# Patient Record
Sex: Male | Born: 1940 | Race: White | Hispanic: No | Marital: Married | Smoking: Former smoker
Health system: Southern US, Community
[De-identification: ages and names within clinical notes are randomized; demographics above are authoritative.]

## PROBLEM LIST (undated history)

## (undated) DIAGNOSIS — K219 Gastro-esophageal reflux disease without esophagitis: Secondary | ICD-10-CM

## (undated) DIAGNOSIS — B029 Zoster without complications: Secondary | ICD-10-CM

## (undated) DIAGNOSIS — H269 Unspecified cataract: Secondary | ICD-10-CM

## (undated) DIAGNOSIS — Z8719 Personal history of other diseases of the digestive system: Secondary | ICD-10-CM

## (undated) DIAGNOSIS — H409 Unspecified glaucoma: Secondary | ICD-10-CM

## (undated) DIAGNOSIS — R350 Frequency of micturition: Secondary | ICD-10-CM

## (undated) DIAGNOSIS — R519 Headache, unspecified: Secondary | ICD-10-CM

## (undated) DIAGNOSIS — J189 Pneumonia, unspecified organism: Secondary | ICD-10-CM

## (undated) DIAGNOSIS — R51 Headache: Secondary | ICD-10-CM

## (undated) DIAGNOSIS — I10 Essential (primary) hypertension: Secondary | ICD-10-CM

## (undated) DIAGNOSIS — E7800 Pure hypercholesterolemia, unspecified: Secondary | ICD-10-CM

## (undated) DIAGNOSIS — R42 Dizziness and giddiness: Secondary | ICD-10-CM

## (undated) DIAGNOSIS — M109 Gout, unspecified: Secondary | ICD-10-CM

---

## 2009-05-04 ENCOUNTER — Ambulatory Visit: Payer: Self-pay | Admitting: Orthopedic Surgery

## 2009-05-11 ENCOUNTER — Inpatient Hospital Stay: Payer: Self-pay | Admitting: Orthopedic Surgery

## 2009-05-11 HISTORY — PX: JOINT REPLACEMENT: SHX530

## 2009-09-08 ENCOUNTER — Ambulatory Visit: Payer: Self-pay | Admitting: Orthopedic Surgery

## 2009-11-21 DIAGNOSIS — B029 Zoster without complications: Secondary | ICD-10-CM

## 2009-11-21 HISTORY — PX: HEMORROIDECTOMY: SUR656

## 2009-11-21 HISTORY — DX: Zoster without complications: B02.9

## 2012-01-18 ENCOUNTER — Ambulatory Visit: Payer: Self-pay | Admitting: Specialist

## 2012-05-27 ENCOUNTER — Other Ambulatory Visit: Payer: Self-pay | Admitting: Orthopedic Surgery

## 2012-05-27 MED ORDER — BUPIVACAINE 0.25 % ON-Q PUMP SINGLE CATH 300ML: 300.0000 mL | INJECTION | Status: DC

## 2012-05-27 MED ORDER — DEXAMETHASONE SODIUM PHOSPHATE 10 MG/ML IJ SOLN: 10.0000 mg | Freq: Once | INTRAMUSCULAR | Status: DC

## 2012-05-27 NOTE — Progress Notes (Signed)
Preoperative surgical orders have been place into the Epic hospital system for Kenneth Parker on 05/27/2012, 10:16 PM  by Patrica Duel for surgery on 08/10/2012.  Preop Total Knee orders including Bupivacaine On-Q pump, IV Tylenol, and IV Decadron as long as there are no contraindications to the above medications. Avel Peace, PA-C

## 2012-08-02 ENCOUNTER — Encounter (HOSPITAL_COMMUNITY)
Admission: RE | Admit: 2012-08-02 | Discharge: 2012-08-02 | Disposition: A | Discharge status: Home / Self Care | Payer: Medicare Other | Source: Ambulatory Visit | Attending: Orthopedic Surgery | Admitting: Orthopedic Surgery

## 2012-08-02 ENCOUNTER — Encounter (HOSPITAL_COMMUNITY): Payer: Self-pay

## 2012-08-02 ENCOUNTER — Ambulatory Visit (HOSPITAL_COMMUNITY)
Admission: RE | Admit: 2012-08-02 | Discharge: 2012-08-02 | Disposition: A | Discharge status: Home / Self Care | Payer: Medicare Other | Source: Ambulatory Visit | Attending: Orthopedic Surgery | Admitting: Orthopedic Surgery

## 2012-08-02 DIAGNOSIS — Y838 Other surgical procedures as the cause of abnormal reaction of the patient, or of later complication, without mention of misadventure at the time of the procedure: Secondary | ICD-10-CM | POA: Insufficient documentation

## 2012-08-02 DIAGNOSIS — Z79899 Other long term (current) drug therapy: Secondary | ICD-10-CM | POA: Insufficient documentation

## 2012-08-02 DIAGNOSIS — T84039A Mechanical loosening of unspecified internal prosthetic joint, initial encounter: Secondary | ICD-10-CM | POA: Insufficient documentation

## 2012-08-02 DIAGNOSIS — K219 Gastro-esophageal reflux disease without esophagitis: Secondary | ICD-10-CM | POA: Insufficient documentation

## 2012-08-02 DIAGNOSIS — Z01810 Encounter for preprocedural cardiovascular examination: Secondary | ICD-10-CM | POA: Insufficient documentation

## 2012-08-02 DIAGNOSIS — K449 Diaphragmatic hernia without obstruction or gangrene: Secondary | ICD-10-CM | POA: Insufficient documentation

## 2012-08-02 DIAGNOSIS — Z96659 Presence of unspecified artificial knee joint: Secondary | ICD-10-CM | POA: Insufficient documentation

## 2012-08-02 DIAGNOSIS — I10 Essential (primary) hypertension: Secondary | ICD-10-CM | POA: Insufficient documentation

## 2012-08-02 DIAGNOSIS — Z01812 Encounter for preprocedural laboratory examination: Secondary | ICD-10-CM | POA: Insufficient documentation

## 2012-08-02 HISTORY — DX: Frequency of micturition: R35.0

## 2012-08-02 HISTORY — DX: Gout, unspecified: M10.9

## 2012-08-02 HISTORY — DX: Essential (primary) hypertension: I10

## 2012-08-02 HISTORY — DX: Zoster without complications: B02.9

## 2012-08-02 HISTORY — DX: Dizziness and giddiness: R42

## 2012-08-02 HISTORY — DX: Pure hypercholesterolemia, unspecified: E78.00

## 2012-08-02 HISTORY — DX: Pneumonia, unspecified organism: J18.9

## 2012-08-02 HISTORY — DX: Personal history of other diseases of the digestive system: Z87.19

## 2012-08-02 LAB — APTT: aPTT: 38 seconds — ABNORMAL HIGH (ref 24–37)

## 2012-08-02 LAB — CBC
MCV: 84.3 fL (ref 78.0–100.0)
Platelets: 225 10*3/uL (ref 150–400)
RBC: 5.21 MIL/uL (ref 4.22–5.81)
WBC: 10.5 10*3/uL (ref 4.0–10.5)

## 2012-08-02 LAB — URINALYSIS, ROUTINE W REFLEX MICROSCOPIC
Bilirubin Urine: NEGATIVE
Leukocytes, UA: NEGATIVE
Nitrite: NEGATIVE
Specific Gravity, Urine: 1.016 (ref 1.005–1.030)
pH: 5 (ref 5.0–8.0)

## 2012-08-02 LAB — COMPREHENSIVE METABOLIC PANEL
ALT: 28 U/L (ref 0–53)
AST: 23 U/L (ref 0–37)
CO2: 24 mEq/L (ref 19–32)
Chloride: 104 mEq/L (ref 96–112)
GFR calc non Af Amer: 84 mL/min — ABNORMAL LOW (ref >90)
Potassium: 4.1 mEq/L (ref 3.5–5.1)
Sodium: 138 mEq/L (ref 135–145)
Total Bilirubin: 0.6 mg/dL (ref 0.3–1.2)

## 2012-08-02 NOTE — Patient Instructions (Addendum)
[address] [place]  08/02/2012   Your procedure is scheduled on:  08-10-2012  Report to [facility] at  915 AM.  Call this number if you have problems the morning of surgery: [phone]   Remember:   Do not eat food or drink liquids:After Midnight.  .  Take these medicines the morning of surgery with A SIP OF WATER: omeprazole   Do not wear jewelry or make up.  Do not wear lotions, powders, or perfumes.Do not wear deodorant.    Do not bring valuables to the hospital.  Contacts, dentures or bridgework may not be worn into surgery.  Leave suitcase in the car. After surgery it may be brought to your room.  For patients admitted to the hospital, checkout time is 11:00 AM the day of discharge                             Patients discharged the day of surgery will not be allowed to drive home. If going home same day of surgery, you must have someone stay with you the first 24 hours at home and arrange for some one to drive you home from hospital.    Special Instructions: CHG Shower Use Special Wash: 1/2 bottle night before surgery and 1/2 bottle morning  of surgery, use regular soap on face and front and back private area. Women do not shave legs or underarms for 2 days before showers. Men may shave face morning of surgery.  please read over the following fact sheets that you were given: MRSA Information, blood fact sheet, incentive spirometer fact sheet  Cain [facility] pre op nurse phone number [phone], call if needed , call Kailer Heindel after you see gr granger 08-06-2012 at 300 pm and tell her what dr granger said about your toe

## 2012-08-02 NOTE — Pre-Procedure Instructions (Signed)
Medical clearance note dr Theophilus Bones on chart

## 2012-08-02 NOTE — Progress Notes (Signed)
Kenneth Peace pa saw left middle toe red area per pt, pt instructed to call dr granger and show dr granger left middle toe.

## 2012-08-02 NOTE — Progress Notes (Signed)
08/02/12 1420  OBSTRUCTIVE SLEEP APNEA  Have you ever been diagnosed with sleep apnea through a sleep study? No  Do you snore loudly (loud enough to be heard through closed doors)?  1  Do you often feel tired, fatigued, or sleepy during the daytime? 0  Has anyone observed you stop breathing during your sleep? 0  Do you have, or are you being treated for high blood pressure? 1  BMI more than 35 kg/m2? 0  Age over 73 years old? 1  Neck circumference greater than 40 cm/18 inches? 0  Gender: 1  Obstructive Sleep Apnea Score 4   Score 4 or greater  Updated health history;Results sent to PCP

## 2012-08-07 NOTE — Progress Notes (Addendum)
Spoke with pt, pt saw dr Theophilus Bones and colchicine 0.6 mg bid added for gout, left middle  toe healing well, dr Lequita Halt aware of gout per pt, pt instructed to take clochicine am of surgery with sip of water

## 2012-08-09 ENCOUNTER — Other Ambulatory Visit: Payer: Self-pay | Admitting: Orthopedic Surgery

## 2012-08-09 NOTE — H&P (Signed)
Kenneth Parker  DOB: 06/21/1941 Married / Language: English / Race: White Male  Date of Admission:  08/10/2012  Chief complaint:  Right Knee Pain  History of Present Illness The patient is a 71 year old male who comes in for a preoperative History and Physical. The patient is scheduled for a right right knee tibial verus total knee revision to be performed by Dr. Frank V. Aluisio, MD at [facility] on 08/10/2012. The patient is a 71 year old male who presents today for follow up of their knee. The patient is being followed for their right painful total knee. Symptoms reported today include: pain and instability. The patient feels that they are doing poorly. Current treatment includes: bracing. The patient has not gotten any relief of their symptoms with NSAIDs (the Voltaren gel didnt seem to help at all). When I saw him last visit, I was concerned about possible loosening of the tibial component vs. just a post-contusion pain. Unfortunately, he's getting worse. He said despite the Voltaren gel and despite the brace he's still having a lot of discomfort. It's mainly anteromedial and just below the joint line. They have been treated conservatively in the past for the above stated problem and despite conservative measures, they continue to have progressive pain and severe functional limitations and dysfunction. They have failed non-operative management. It is felt that they would benefit from undergoing total joint replacement. Risks and benefits of the procedure have been discussed with the patient and they elect to proceed with surgery. There are no active contraindications to surgery such as ongoing infection or rapidly progressive neurological disease.   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 father Father. Deceased, Myocardial infarction. age  62 Mother. Deceased, Congestive Heart Failure. age 85   Social History Living situation. live with spouse Marital status. married Illicit drug use. no Drug/Alcohol Rehab (Previously). no Exercise. Exercises daily; does running / walking Tobacco use. former smoker; smoke(d) 1 pack(s) per day Pain Contract. no Tobacco / smoke exposure. no Drug/Alcohol Rehab (Currently). no Children. 3 Current work status. retired Alcohol use. former drinker; quit 1968 Post-Surgical Plans. Plan is to go home   Medication History Lisinopril-Hydrochlorothiazide (20-12.5MG Tablet, Oral) Active. Omeprazole (20MG Tablet DR, Oral) Active. Simvastatin (20MG Tablet, Oral) Active. Allopurinol (100MG Tablet, Oral) Active. Vitamin C ER (500MG Capsule ER, Oral) Active. Fish Oil Burp-Less (1000MG Capsule, Oral) Active. Aspirin (81MG Tablet, Oral) Active. Duradrin (325-65-100MG Capsule, Oral) Active. (prn) HydrOXYzine HCl (25MG Tablet, Oral) Active. Meclizine HCl (25MG Tablet, Oral) Active. DiphenhydrAMINE HCl (25MG Capsule, Oral) Active.   Past Surgical History Total Knee Replacement. Date: 05/11/2009. right Hemorrhoidectomy. Date: 2011.  Medical History Gout High blood pressure Hypercholesterolemia Migraine Headache Migraine Headache Impaired Vision Impaired Hearing Vertigo Shingles Pneumonia Hiatal Hernia Measles Mumps   Review of Systems General:Not Present- Chills, Fever, Night Sweats, Fatigue, Weight Gain, Weight Loss and Memory Loss. Skin:Not Present- Hives, Itching, Rash, Eczema and Lesions. HEENT:Present- Hearing Loss. Not Present- Tinnitus, Headache, Double Vision, Visual Loss and Dentures. Respiratory:Not Present- Shortness of breath with exertion, Shortness of breath at rest, Allergies, Coughing up blood and Chronic Cough. Cardiovascular:Not Present- Chest Pain, Racing/skipping heartbeats, Difficulty Breathing Lying Down, Murmur, Swelling and  Palpitations. Gastrointestinal:Not Present- Bloody Stool, Heartburn, Abdominal Pain, Vomiting, Nausea, Constipation, Diarrhea, Difficulty Swallowing, Jaundice and Loss of appetitie. Male Genitourinary:Present- Urinary frequency, Weak urinary stream and Nocturia. Not Present- Blood in Urine, Discharge, Flank Pain, Incontinence,   Painful Urination, Urgency, Urinary Retention and Urinating at Night. Musculoskeletal:Present- Joint Pain. Not Present- Muscle Weakness, Muscle Pain, Joint Swelling, Back Pain, Morning Stiffness and Spasms. Neurological:Not Present- Tremor, Dizziness, Blackout spells, Paralysis, Difficulty with balance and Weakness. Psychiatric:Not Present- Insomnia.   Vitals Weight: 213 lb Height: 73 in Body Surface Area: 2.23 m Body Mass Index: 28.1 kg/m Pulse: 56 (Regular) Resp.: 12 (Unlabored) BP: 134/86 (Sitting, Left Arm, Standard)    Physical Exam The physical exam findings are as follows:  Note: Patient is a 71 year old male with continued knee pain.   General Mental Status - Alert, cooperative and good historian. General Appearance- pleasant. Not in acute distress. Orientation- Oriented X3. Build & Nutrition- Well nourished and Well developed.   Head and Neck Head- normocephalic, atraumatic . Neck Global Assessment- supple. no bruit auscultated on the right and no bruit auscultated on the left.   Note: Hearing aids bilateral Full upper denture and patial lower denture  Eye Pupil- Bilateral- Regular and Round. Motion- Bilateral- EOMI.   Note: wears glasses  Chest and Lung Exam Auscultation: Breath sounds:- clear at anterior chest wall and - clear at posterior chest wall. Adventitious sounds:- No Adventitious sounds.   Cardiovascular Auscultation:Rhythm- Regular rate and rhythm. Heart Sounds- S1 WNL and S2 WNL. Murmurs & Other Heart Sounds: Murmur 1:Location- Aortic Area. Timing- Mid-systolic. Grade-  II/VI. Character- Low pitched.   Abdomen Palpation/Percussion:Tenderness- Abdomen is non-tender to palpation. Rigidity (guarding)- Abdomen is soft. Auscultation:Auscultation of the abdomen reveals - Bowel sounds normal.   Male Genitourinary  Not done, not pertinent to present illness  Musculoskeletal On exam, he's alert and oriented, in no apparent distress. His right knee ROM is about 5-115. He does have AP laxity in flexion. He does not have varus or valgus laxity in extension.  RADIOGRAPHS: On review of his radiographs again, I do not see any gross loosening, but there may be a lucency around the tibial component on the lateral view.  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 Aluisio.  Plan is to go home.  PCP - Dr. Todd Granger [place], [place]  Signed electronically by DREW L Kasen Sako, PA-C  

## 2012-08-10 ENCOUNTER — Encounter (HOSPITAL_COMMUNITY): Payer: Self-pay | Admitting: Anesthesiology

## 2012-08-10 ENCOUNTER — Encounter (HOSPITAL_COMMUNITY): Payer: Self-pay | Admitting: *Deleted

## 2012-08-10 ENCOUNTER — Ambulatory Visit (HOSPITAL_COMMUNITY): Payer: Medicare Other | Admitting: Anesthesiology

## 2012-08-10 ENCOUNTER — Inpatient Hospital Stay (HOSPITAL_COMMUNITY)
Admission: RE | Admit: 2012-08-10 | Discharge: 2012-08-13 | DRG: 468 | Disposition: A | Discharge status: Home Health | Payer: Medicare Other | Source: Ambulatory Visit | Attending: Orthopedic Surgery | Admitting: Orthopedic Surgery

## 2012-08-10 ENCOUNTER — Encounter (HOSPITAL_COMMUNITY)
Admission: RE | Disposition: A | Discharge status: Home Health | Payer: Self-pay | Source: Ambulatory Visit | Attending: Orthopedic Surgery

## 2012-08-10 DIAGNOSIS — Z96659 Presence of unspecified artificial knee joint: Secondary | ICD-10-CM

## 2012-08-10 DIAGNOSIS — K219 Gastro-esophageal reflux disease without esophagitis: Secondary | ICD-10-CM | POA: Diagnosis present

## 2012-08-10 DIAGNOSIS — Y831 Surgical operation with implant of artificial internal device as the cause of abnormal reaction of the patient, or of later complication, without mention of misadventure at the time of the procedure: Secondary | ICD-10-CM | POA: Diagnosis present

## 2012-08-10 DIAGNOSIS — T84018A Broken internal joint prosthesis, other site, initial encounter: Secondary | ICD-10-CM

## 2012-08-10 DIAGNOSIS — I10 Essential (primary) hypertension: Secondary | ICD-10-CM | POA: Diagnosis present

## 2012-08-10 DIAGNOSIS — T84099A Other mechanical complication of unspecified internal joint prosthesis, initial encounter: Principal | ICD-10-CM | POA: Diagnosis present

## 2012-08-10 DIAGNOSIS — K449 Diaphragmatic hernia without obstruction or gangrene: Secondary | ICD-10-CM | POA: Diagnosis present

## 2012-08-10 DIAGNOSIS — M1710 Unilateral primary osteoarthritis, unspecified knee: Secondary | ICD-10-CM | POA: Diagnosis present

## 2012-08-10 HISTORY — PX: TOTAL KNEE REVISION: SHX996

## 2012-08-10 LAB — GRAM STAIN

## 2012-08-10 LAB — TYPE AND SCREEN
ABO/RH(D): O POS
Antibody Screen: NEGATIVE

## 2012-08-10 SURGERY — TOTAL KNEE REVISION
Anesthesia: General | Site: Knee | Laterality: Right | Wound class: Clean

## 2012-08-10 MED ORDER — MENTHOL 3 MG MT LOZG
1.0000 | LOZENGE | OROMUCOSAL | Status: DC | PRN
  Filled 2012-08-10: qty 9

## 2012-08-10 MED ORDER — RIVAROXABAN 10 MG PO TABS
10.0000 mg | ORAL_TABLET | Freq: Every day | ORAL | Status: DC
  Administered 2012-08-11 – 2012-08-13 (×3): 10 mg via ORAL
  Filled 2012-08-10 (×4): qty 1

## 2012-08-10 MED ORDER — DEXAMETHASONE SODIUM PHOSPHATE 10 MG/ML IJ SOLN
INTRAMUSCULAR | Status: DC | PRN
  Administered 2012-08-10: 10 mg via INTRAVENOUS

## 2012-08-10 MED ORDER — HYDROMORPHONE HCL PF 1 MG/ML IJ SOLN
INTRAMUSCULAR | Status: AC
  Filled 2012-08-10: qty 1

## 2012-08-10 MED ORDER — ACETAMINOPHEN 10 MG/ML IV SOLN
1000.0000 mg | Freq: Four times a day (QID) | INTRAVENOUS | Status: AC
  Administered 2012-08-10 – 2012-08-11 (×4): 1000 mg via INTRAVENOUS
  Filled 2012-08-10 (×4): qty 100

## 2012-08-10 MED ORDER — HYDROXYZINE HCL 25 MG PO TABS
25.0000 mg | ORAL_TABLET | Freq: Three times a day (TID) | ORAL | Status: DC | PRN
  Filled 2012-08-10: qty 1

## 2012-08-10 MED ORDER — SUCCINYLCHOLINE CHLORIDE 20 MG/ML IJ SOLN
INTRAMUSCULAR | Status: DC | PRN
  Administered 2012-08-10: 100 mg via INTRAVENOUS

## 2012-08-10 MED ORDER — SODIUM CHLORIDE 0.9 % IR SOLN
Status: DC | PRN
  Administered 2012-08-10: 1

## 2012-08-10 MED ORDER — CEFAZOLIN SODIUM 1-5 GM-% IV SOLN
1.0000 g | Freq: Four times a day (QID) | INTRAVENOUS | Status: AC
  Administered 2012-08-10 (×2): 1 g via INTRAVENOUS
  Filled 2012-08-10 (×2): qty 50

## 2012-08-10 MED ORDER — NALOXONE HCL 0.4 MG/ML IJ SOLN: 0.4000 mg | INTRAMUSCULAR | Status: DC | PRN

## 2012-08-10 MED ORDER — ALLOPURINOL 100 MG PO TABS
100.0000 mg | ORAL_TABLET | Freq: Every day | ORAL | Status: DC
  Administered 2012-08-11 – 2012-08-13 (×3): 100 mg via ORAL
  Filled 2012-08-10 (×4): qty 1

## 2012-08-10 MED ORDER — METOCLOPRAMIDE HCL 10 MG PO TABS: 5.0000 mg | ORAL_TABLET | Freq: Three times a day (TID) | ORAL | Status: DC | PRN

## 2012-08-10 MED ORDER — MECLIZINE HCL 25 MG PO TABS
25.0000 mg | ORAL_TABLET | Freq: Three times a day (TID) | ORAL | Status: DC | PRN
  Filled 2012-08-10: qty 1

## 2012-08-10 MED ORDER — DEXTROSE 5 % IV SOLN
3.0000 g | INTRAVENOUS | Status: AC
  Administered 2012-08-10: 2 g via INTRAVENOUS
  Filled 2012-08-10: qty 3000

## 2012-08-10 MED ORDER — FLEET ENEMA 7-19 GM/118ML RE ENEM: 1.0000 | ENEMA | Freq: Once | RECTAL | Status: AC | PRN

## 2012-08-10 MED ORDER — MEPERIDINE HCL 50 MG/ML IJ SOLN: 6.2500 mg | INTRAMUSCULAR | Status: DC | PRN

## 2012-08-10 MED ORDER — SIMVASTATIN 10 MG PO TABS
10.0000 mg | ORAL_TABLET | Freq: Every day | ORAL | Status: DC
  Administered 2012-08-11 – 2012-08-12 (×2): 10 mg via ORAL
  Filled 2012-08-10 (×4): qty 1

## 2012-08-10 MED ORDER — METOCLOPRAMIDE HCL 5 MG/ML IJ SOLN: 5.0000 mg | Freq: Three times a day (TID) | INTRAMUSCULAR | Status: DC | PRN

## 2012-08-10 MED ORDER — OXYCODONE HCL 5 MG PO TABS: 5.0000 mg | ORAL_TABLET | ORAL | Status: DC | PRN

## 2012-08-10 MED ORDER — SODIUM CHLORIDE 0.9 % IJ SOLN: 9.0000 mL | INTRAMUSCULAR | Status: DC | PRN

## 2012-08-10 MED ORDER — LACTATED RINGERS IV SOLN: INTRAVENOUS | Status: DC

## 2012-08-10 MED ORDER — PHENOL 1.4 % MT LIQD
1.0000 | OROMUCOSAL | Status: DC | PRN
  Filled 2012-08-10: qty 177

## 2012-08-10 MED ORDER — ONDANSETRON HCL 4 MG/2ML IJ SOLN
INTRAMUSCULAR | Status: DC | PRN
  Administered 2012-08-10: 4 mg via INTRAVENOUS

## 2012-08-10 MED ORDER — METHOCARBAMOL 500 MG PO TABS
500.0000 mg | ORAL_TABLET | Freq: Four times a day (QID) | ORAL | Status: DC | PRN
  Administered 2012-08-11 – 2012-08-13 (×6): 500 mg via ORAL
  Filled 2012-08-10 (×6): qty 1

## 2012-08-10 MED ORDER — PROPOFOL 10 MG/ML IV BOLUS
INTRAVENOUS | Status: DC | PRN
  Administered 2012-08-10: 170 mg via INTRAVENOUS

## 2012-08-10 MED ORDER — COLCHICINE 0.6 MG PO TABS
0.6000 mg | ORAL_TABLET | Freq: Two times a day (BID) | ORAL | Status: DC
  Administered 2012-08-12 – 2012-08-13 (×2): 0.6 mg via ORAL
  Filled 2012-08-10 (×7): qty 1

## 2012-08-10 MED ORDER — DIPHENHYDRAMINE HCL 12.5 MG/5ML PO ELIX: 12.5000 mg | ORAL_SOLUTION | ORAL | Status: DC | PRN

## 2012-08-10 MED ORDER — PANTOPRAZOLE SODIUM 40 MG PO TBEC
40.0000 mg | DELAYED_RELEASE_TABLET | Freq: Every day | ORAL | Status: DC
  Administered 2012-08-11 – 2012-08-12 (×2): 40 mg via ORAL
  Filled 2012-08-10 (×3): qty 1

## 2012-08-10 MED ORDER — ONDANSETRON HCL 4 MG/2ML IJ SOLN: 4.0000 mg | Freq: Four times a day (QID) | INTRAMUSCULAR | Status: DC | PRN

## 2012-08-10 MED ORDER — DIPHENHYDRAMINE HCL 12.5 MG/5ML PO ELIX: 12.5000 mg | ORAL_SOLUTION | Freq: Four times a day (QID) | ORAL | Status: DC | PRN

## 2012-08-10 MED ORDER — BUPIVACAINE 0.25 % ON-Q PUMP DUAL CATH 300 ML
INJECTION | Status: DC | PRN
  Administered 2012-08-10: 300 mL

## 2012-08-10 MED ORDER — TRAMADOL HCL 50 MG PO TABS
50.0000 mg | ORAL_TABLET | Freq: Four times a day (QID) | ORAL | Status: DC | PRN
Start: 2012-08-10 — End: 2012-08-13
  Administered 2012-08-12 – 2012-08-13 (×2): 100 mg via ORAL
  Filled 2012-08-10 (×2): qty 2

## 2012-08-10 MED ORDER — ACETAMINOPHEN 10 MG/ML IV SOLN
INTRAVENOUS | Status: AC
  Filled 2012-08-10: qty 100

## 2012-08-10 MED ORDER — SODIUM CHLORIDE 0.9 % IV SOLN: INTRAVENOUS | Status: DC

## 2012-08-10 MED ORDER — DIPHENHYDRAMINE HCL 50 MG/ML IJ SOLN: 12.5000 mg | Freq: Four times a day (QID) | INTRAMUSCULAR | Status: DC | PRN

## 2012-08-10 MED ORDER — ACETAMINOPHEN 650 MG RE SUPP: 650.0000 mg | Freq: Four times a day (QID) | RECTAL | Status: DC | PRN

## 2012-08-10 MED ORDER — LACTATED RINGERS IV SOLN
INTRAVENOUS | Status: DC | PRN
  Administered 2012-08-10 (×2): via INTRAVENOUS

## 2012-08-10 MED ORDER — METHOCARBAMOL 500 MG PO TABS: 500.0000 mg | ORAL_TABLET | Freq: Four times a day (QID) | ORAL | Status: DC | PRN

## 2012-08-10 MED ORDER — ACETAMINOPHEN 10 MG/ML IV SOLN
1000.0000 mg | Freq: Once | INTRAVENOUS | Status: AC
  Administered 2012-08-10: 1000 mg via INTRAVENOUS

## 2012-08-10 MED ORDER — DOCUSATE SODIUM 100 MG PO CAPS
100.0000 mg | ORAL_CAPSULE | Freq: Two times a day (BID) | ORAL | Status: DC
  Administered 2012-08-11 – 2012-08-13 (×4): 100 mg via ORAL

## 2012-08-10 MED ORDER — BUPIVACAINE 0.25 % ON-Q PUMP SINGLE CATH 300ML
INJECTION | Status: AC
  Filled 2012-08-10: qty 300

## 2012-08-10 MED ORDER — METHOCARBAMOL 100 MG/ML IJ SOLN
500.0000 mg | Freq: Four times a day (QID) | INTRAVENOUS | Status: DC | PRN
  Administered 2012-08-10: 500 mg via INTRAVENOUS
  Filled 2012-08-10: qty 5

## 2012-08-10 MED ORDER — FENTANYL CITRATE 0.05 MG/ML IJ SOLN
INTRAMUSCULAR | Status: DC | PRN
  Administered 2012-08-10 (×2): 25 ug via INTRAVENOUS
  Administered 2012-08-10: 100 ug via INTRAVENOUS
  Administered 2012-08-10: 25 ug via INTRAVENOUS
  Administered 2012-08-10: 50 ug via INTRAVENOUS
  Administered 2012-08-10: 25 ug via INTRAVENOUS

## 2012-08-10 MED ORDER — MORPHINE SULFATE (PF) 1 MG/ML IV SOLN
INTRAVENOUS | Status: DC
  Administered 2012-08-10 (×2): 5 mg via INTRAVENOUS
  Administered 2012-08-10: 8 mg via INTRAVENOUS
  Administered 2012-08-10: 1 mg via INTRAVENOUS
  Administered 2012-08-11: 1.42 mg via INTRAVENOUS
  Administered 2012-08-11: 3 mg via INTRAVENOUS
  Administered 2012-08-11: 01:00:00 via INTRAVENOUS
  Filled 2012-08-10: qty 25

## 2012-08-10 MED ORDER — OXYCODONE HCL 5 MG PO TABS
5.0000 mg | ORAL_TABLET | ORAL | Status: DC | PRN
  Administered 2012-08-10 – 2012-08-13 (×11): 10 mg via ORAL
  Filled 2012-08-10 (×11): qty 2

## 2012-08-10 MED ORDER — POLYETHYLENE GLYCOL 3350 17 G PO PACK: 17.0000 g | PACK | Freq: Every day | ORAL | Status: DC | PRN

## 2012-08-10 MED ORDER — HYDROMORPHONE HCL PF 1 MG/ML IJ SOLN
INTRAMUSCULAR | Status: DC | PRN
  Administered 2012-08-10 (×2): 1 mg via INTRAVENOUS

## 2012-08-10 MED ORDER — SODIUM CHLORIDE 0.9 % IV SOLN
INTRAVENOUS | Status: DC
  Administered 2012-08-10 – 2012-08-11 (×2): via INTRAVENOUS

## 2012-08-10 MED ORDER — BUPIVACAINE ON-Q PAIN PUMP (FOR ORDER SET NO CHG)
INJECTION | Status: DC
  Filled 2012-08-10: qty 1

## 2012-08-10 MED ORDER — ACETAMINOPHEN 325 MG PO TABS
650.0000 mg | ORAL_TABLET | Freq: Four times a day (QID) | ORAL | Status: DC | PRN
  Administered 2012-08-11: 650 mg via ORAL
  Filled 2012-08-10: qty 2

## 2012-08-10 MED ORDER — RIVAROXABAN 10 MG PO TABS: 10.0000 mg | ORAL_TABLET | Freq: Every day | ORAL | Status: DC

## 2012-08-10 MED ORDER — BISACODYL 10 MG RE SUPP: 10.0000 mg | Freq: Every day | RECTAL | Status: DC | PRN

## 2012-08-10 MED ORDER — HYDROMORPHONE HCL PF 1 MG/ML IJ SOLN
0.2500 mg | INTRAMUSCULAR | Status: DC | PRN
  Administered 2012-08-10 (×4): 0.5 mg via INTRAVENOUS

## 2012-08-10 MED ORDER — MORPHINE SULFATE (PF) 1 MG/ML IV SOLN
INTRAVENOUS | Status: AC
  Administered 2012-08-11: 2 mg via INTRAVENOUS
  Filled 2012-08-10: qty 25

## 2012-08-10 MED ORDER — PROMETHAZINE HCL 25 MG/ML IJ SOLN: 6.2500 mg | INTRAMUSCULAR | Status: DC | PRN

## 2012-08-10 MED ORDER — ONDANSETRON HCL 4 MG PO TABS
4.0000 mg | ORAL_TABLET | Freq: Four times a day (QID) | ORAL | Status: DC | PRN
  Administered 2012-08-12 – 2012-08-13 (×2): 4 mg via ORAL
  Filled 2012-08-10 (×2): qty 1

## 2012-08-10 MED ORDER — CEFAZOLIN SODIUM-DEXTROSE 2-3 GM-% IV SOLR
INTRAVENOUS | Status: AC
  Filled 2012-08-10: qty 50

## 2012-08-10 MED ORDER — ATROPINE SULFATE 0.4 MG/ML IJ SOLN
INTRAMUSCULAR | Status: DC | PRN
  Administered 2012-08-10: 0.4 mg via INTRAVENOUS

## 2012-08-10 SURGICAL SUPPLY — 58 items
BAG ZIPLOCK 12X15 (MISCELLANEOUS) ×2 IMPLANT
BANDAGE ELASTIC 6 VELCRO ST LF (GAUZE/BANDAGES/DRESSINGS) ×2 IMPLANT
BANDAGE ESMARK 6X9 LF (GAUZE/BANDAGES/DRESSINGS) ×1 IMPLANT
BLADE SAG 18X100X1.27 (BLADE) ×2 IMPLANT
BLADE SAW SGTL 11.0X1.19X90.0M (BLADE) ×2 IMPLANT
BNDG ESMARK 6X9 LF (GAUZE/BANDAGES/DRESSINGS) ×2
CATH KIT ON-Q SILVERSOAK 5IN (CATHETERS) ×2 IMPLANT
CEMENT HV SMART SET (Cement) ×2 IMPLANT
CLOTH BEACON ORANGE TIMEOUT ST (SAFETY) ×2 IMPLANT
CONT SPECI 4OZ STER CLIK (MISCELLANEOUS) ×2 IMPLANT
CUFF TOURN SGL QUICK 34 (TOURNIQUET CUFF) ×1
CUFF TRNQT CYL 34X4X40X1 (TOURNIQUET CUFF) ×1 IMPLANT
DRAPE EXTREMITY T 121X128X90 (DRAPE) ×2 IMPLANT
DRAPE POUCH INSTRU U-SHP 10X18 (DRAPES) ×2 IMPLANT
DRAPE U-SHAPE 47X51 STRL (DRAPES) ×2 IMPLANT
DRSG ADAPTIC 3X8 NADH LF (GAUZE/BANDAGES/DRESSINGS) ×2 IMPLANT
DRSG PAD ABDOMINAL 8X10 ST (GAUZE/BANDAGES/DRESSINGS) ×2 IMPLANT
DURAPREP 26ML APPLICATOR (WOUND CARE) ×2 IMPLANT
ELECT REM PT RETURN 9FT ADLT (ELECTROSURGICAL) ×2
ELECTRODE REM PT RTRN 9FT ADLT (ELECTROSURGICAL) ×1 IMPLANT
EVACUATOR 1/8 PVC DRAIN (DRAIN) ×2 IMPLANT
FACESHIELD LNG OPTICON STERILE (SAFETY) ×10 IMPLANT
GLOVE BIO SURGEON STRL SZ8 (GLOVE) ×2 IMPLANT
GLOVE BIOGEL PI IND STRL 8 (GLOVE) ×2 IMPLANT
GLOVE BIOGEL PI INDICATOR 8 (GLOVE) ×2
GLOVE ECLIPSE 8.0 STRL XLNG CF (GLOVE) ×2 IMPLANT
GLOVE SURG SS PI 6.5 STRL IVOR (GLOVE) ×4 IMPLANT
GOWN STRL NON-REIN LRG LVL3 (GOWN DISPOSABLE) ×4 IMPLANT
GOWN STRL REIN XL XLG (GOWN DISPOSABLE) ×2 IMPLANT
HANDPIECE INTERPULSE COAX TIP (DISPOSABLE) ×1
IMMOBILIZER KNEE 20 (SOFTGOODS) ×2
IMMOBILIZER KNEE 20 THIGH 36 (SOFTGOODS) ×1 IMPLANT
INSERT TIB LCS RP LRG 15 (Knees) ×2 IMPLANT
KIT BASIN OR (CUSTOM PROCEDURE TRAY) ×2 IMPLANT
MANIFOLD NEPTUNE II (INSTRUMENTS) ×2 IMPLANT
NS IRRIG 1000ML POUR BTL (IV SOLUTION) ×2 IMPLANT
PACK TOTAL JOINT (CUSTOM PROCEDURE TRAY) ×2 IMPLANT
PAD ABD 7.5X8 STRL (GAUZE/BANDAGES/DRESSINGS) ×2 IMPLANT
PADDING CAST COTTON 6X4 STRL (CAST SUPPLIES) ×2 IMPLANT
POSITIONER SURGICAL ARM (MISCELLANEOUS) ×2 IMPLANT
RESTRICTOR CEMENT SZ 5 C-STEM (Cement) ×2 IMPLANT
SET HNDPC FAN SPRY TIP SCT (DISPOSABLE) ×1 IMPLANT
SLEEVE TIB MBT 27/45 40 (Knees) ×2 IMPLANT
SPONGE GAUZE 4X4 12PLY (GAUZE/BANDAGES/DRESSINGS) ×2 IMPLANT
STAPLER VISISTAT 35W (STAPLE) ×2 IMPLANT
STEM TIBIA PFC 13X30MM (Stem) ×2 IMPLANT
SUCTION FRAZIER 12FR DISP (SUCTIONS) ×2 IMPLANT
SUT VIC AB 2-0 CT1 27 (SUTURE) ×3
SUT VIC AB 2-0 CT1 TAPERPNT 27 (SUTURE) ×3 IMPLANT
SWAB COLLECTION DEVICE MRSA (MISCELLANEOUS) ×2 IMPLANT
TOWEL OR 17X26 10 PK STRL BLUE (TOWEL DISPOSABLE) ×4 IMPLANT
TOWER CARTRIDGE SMART MIX (DISPOSABLE) ×2 IMPLANT
TRAY FOLEY CATH 14FRSI W/METER (CATHETERS) ×2 IMPLANT
TRAY REVISION SZ 4 (Knees) ×2 IMPLANT
TUBE ANAEROBIC SPECIMEN COL (MISCELLANEOUS) ×2 IMPLANT
WATER STERILE IRR 1500ML POUR (IV SOLUTION) ×2 IMPLANT
WRAP KNEE MAXI GEL POST OP (GAUZE/BANDAGES/DRESSINGS) ×4 IMPLANT
lcs patella (Patella) ×2 IMPLANT

## 2012-08-10 NOTE — Anesthesia Postprocedure Evaluation (Signed)
  Anesthesia Post-op Note  Patient: Kenneth Parker  Procedure(s) Performed: Procedure(s) (LRB): TOTAL KNEE REVISION (Right)  Patient Location: PACU  Anesthesia Type: General  Level of Consciousness: awake and alert   Airway and Oxygen Therapy: Patient Spontanous Breathing  Post-op Pain: mild  Post-op Assessment: Post-op Vital signs reviewed, Patient's Cardiovascular Status Stable, Respiratory Function Stable, Patent Airway and No signs of Nausea or vomiting  Post-op Vital Signs: stable  Complications: No apparent anesthesia complications

## 2012-08-10 NOTE — H&P (View-Only) (Signed)
Kenneth Parker  DOB: 1941/04/16 Married / Language: English / Race: White Male  Date of Admission:  08/10/2012  Chief complaint:  Right Knee Pain  History of Present Illness The patient is a 71 year old male who comes in for a preoperative History and Physical. The patient is scheduled for a right right knee tibial verus total knee revision to be performed by Dr. Gus Rankin. Aluisio, MD at [facility] on 08/10/2012. The patient is a 71 year old male who presents today for follow up of their knee. The patient is being followed for their right painful total knee. Symptoms reported today include: pain and instability. The patient feels that they are doing poorly. Current treatment includes: bracing. The patient has not gotten any relief of their symptoms with NSAIDs (the Voltaren gel didnt seem to help at all). When I saw him last visit, I was concerned about possible loosening of the tibial component vs. just a post-contusion pain. Unfortunately, he's getting worse. He said despite the Voltaren gel and despite the brace he's still having a lot of discomfort. It's mainly anteromedial and just below the joint line. They have been treated conservatively in the past for the above stated problem and despite conservative measures, they continue to have progressive pain and severe functional limitations and dysfunction. They have failed non-operative management. It is felt that they would benefit from undergoing total joint replacement. Risks and benefits of the procedure have been discussed with the patient and they elect to proceed with surgery. There are no active contraindications to surgery such as ongoing infection or rapidly progressive neurological disease.   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 father Father. Deceased, Myocardial infarction. age  79 Mother. Deceased, Congestive Heart Failure. age 11   Social History Living situation. live with spouse Marital status. married Illicit drug use. no Drug/Alcohol Rehab (Previously). no Exercise. Exercises daily; does running / walking Tobacco use. former smoker; smoke(d) 1 pack(s) per day Pain Contract. no Tobacco / smoke exposure. no Drug/Alcohol Rehab (Currently). no Children. 3 Current work status. retired Alcohol use. former drinker; quit 1968 Post-Surgical Plans. Plan is to go home   Medication History Lisinopril-Hydrochlorothiazide (20-12.5MG  Tablet, Oral) Active. Omeprazole (20MG  Tablet DR, Oral) Active. Simvastatin (20MG  Tablet, Oral) Active. Allopurinol (100MG  Tablet, Oral) Active. Vitamin C ER (500MG  Capsule ER, Oral) Active. Fish Oil Burp-Less (1000MG  Capsule, Oral) Active. Aspirin (81MG  Tablet, Oral) Active. Duradrin (325-65-100MG  Capsule, Oral) Active. (prn) HydrOXYzine HCl (25MG  Tablet, Oral) Active. Meclizine HCl (25MG  Tablet, Oral) Active. DiphenhydrAMINE HCl (25MG  Capsule, Oral) Active.   Past Surgical History Total Knee Replacement. Date: 05/11/2009. right Hemorrhoidectomy. Date: 2011.  Medical History Gout High blood pressure Hypercholesterolemia Migraine Headache Migraine Headache Impaired Vision Impaired Hearing Vertigo Shingles Pneumonia Hiatal Hernia Measles Mumps   Review of Systems General:Not Present- Chills, Fever, Night Sweats, Fatigue, Weight Gain, Weight Loss and Memory Loss. Skin:Not Present- Hives, Itching, Rash, Eczema and Lesions. HEENT:Present- Hearing Loss. Not Present- Tinnitus, Headache, Double Vision, Visual Loss and Dentures. Respiratory:Not Present- Shortness of breath with exertion, Shortness of breath at rest, Allergies, Coughing up blood and Chronic Cough. Cardiovascular:Not Present- Chest Pain, Racing/skipping heartbeats, Difficulty Breathing Lying Down, Murmur, Swelling and  Palpitations. Gastrointestinal:Not Present- Bloody Stool, Heartburn, Abdominal Pain, Vomiting, Nausea, Constipation, Diarrhea, Difficulty Swallowing, Jaundice and Loss of appetitie. Male Genitourinary:Present- Urinary frequency, Weak urinary stream and Nocturia. Not Present- Blood in Urine, Discharge, Flank Pain, Incontinence,  Painful Urination, Urgency, Urinary Retention and Urinating at Night. Musculoskeletal:Present- Joint Pain. Not Present- Muscle Weakness, Muscle Pain, Joint Swelling, Back Pain, Morning Stiffness and Spasms. Neurological:Not Present- Tremor, Dizziness, Blackout spells, Paralysis, Difficulty with balance and Weakness. Psychiatric:Not Present- Insomnia.   Vitals Weight: 213 lb Height: 73 in Body Surface Area: 2.23 m Body Mass Index: 28.1 kg/m Pulse: 56 (Regular) Resp.: 12 (Unlabored) BP: 134/86 (Sitting, Left Arm, Standard)    Physical Exam The physical exam findings are as follows:  Note: Patient is a 71 year old male with continued knee pain.   General Mental Status - Alert, cooperative and good historian. General Appearance- pleasant. Not in acute distress. Orientation- Oriented X3. Build & Nutrition- Well nourished and Well developed.   Head and Neck Head- normocephalic, atraumatic . Neck Global Assessment- supple. no bruit auscultated on the right and no bruit auscultated on the left.   Note: Hearing aids bilateral Full upper denture and patial lower denture  Eye Pupil- Bilateral- Regular and Round. Motion- Bilateral- EOMI.   Note: wears glasses  Chest and Lung Exam Auscultation: Breath sounds:- clear at anterior chest wall and - clear at posterior chest wall. Adventitious sounds:- No Adventitious sounds.   Cardiovascular Auscultation:Rhythm- Regular rate and rhythm. Heart Sounds- S1 WNL and S2 WNL. Murmurs & Other Heart Sounds: Murmur 1:Location- Aortic Area. Timing- Mid-systolic. Grade-  II/VI. Character- Low pitched.   Abdomen Palpation/Percussion:Tenderness- Abdomen is non-tender to palpation. Rigidity (guarding)- Abdomen is soft. Auscultation:Auscultation of the abdomen reveals - Bowel sounds normal.   Male Genitourinary  Not done, not pertinent to present illness  Musculoskeletal On exam, he's alert and oriented, in no apparent distress. His right knee ROM is about 5-115. He does have AP laxity in flexion. He does not have varus or valgus laxity in extension.  RADIOGRAPHS: On review of his radiographs again, I do not see any gross loosening, but there may be a lucency around the tibial component on the lateral view.  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 Lequita Halt.  Plan is to go home.  PCP - Dr. Venetia Maxon Kendell Bane,   Signed electronically by Roberts Gaudy, PA-C

## 2012-08-10 NOTE — Anesthesia Preprocedure Evaluation (Signed)
Anesthesia Evaluation  Patient identified by MRN, date of birth, ID band Patient awake    Reviewed: Allergy & Precautions, H&P , NPO status , Patient's Chart, lab work & pertinent test results  Airway Mallampati: II TM Distance: >3 FB Neck ROM: Full    Dental No notable dental hx. (+) Upper Dentures   Pulmonary neg pulmonary ROS, neg pneumonia -,  breath sounds clear to auscultation  Pulmonary exam normal       Cardiovascular hypertension, Pt. on medications negative cardio ROS  Rhythm:Regular Rate:Normal     Neuro/Psych negative neurological ROS  negative psych ROS   GI/Hepatic negative GI ROS, Neg liver ROS, hiatal hernia, GERD-  Medicated and Controlled,  Endo/Other  negative endocrine ROS  Renal/GU negative Renal ROS  negative genitourinary   Musculoskeletal negative musculoskeletal ROS (+)   Abdominal   Peds negative pediatric ROS (+)  Hematology negative hematology ROS (+)   Anesthesia Other Findings   Reproductive/Obstetrics negative OB ROS                           Anesthesia Physical Anesthesia Plan  ASA: II  Anesthesia Plan: General   Post-op Pain Management:    Induction: Intravenous  Airway Management Planned: Oral ETT  Additional Equipment:   Intra-op Plan:   Post-operative Plan: Extubation in OR  Informed Consent: I have reviewed the patients History and Physical, chart, labs and discussed the procedure including the risks, benefits and alternatives for the proposed anesthesia with the patient or authorized representative who has indicated his/her understanding and acceptance.   Dental advisory given  Plan Discussed with: CRNA  Anesthesia Plan Comments:         Anesthesia Quick Evaluation

## 2012-08-10 NOTE — Plan of Care (Signed)
Problem: Consults Goal: Diagnosis- Total Joint Replacement Right total knee     

## 2012-08-10 NOTE — Interval H&P Note (Signed)
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 After consideration of risks, benefits and other options for treatment, the patient has consented to  Procedure(s) (LRB) with comments: TOTAL KNEE REVISION (Right) as a surgical intervention .  The patient's history has been reviewed, patient examined, no change in status, stable for surgery.  I have reviewed the patient's chart and labs.  Questions were answered to the patient's satisfaction.     Loanne Drilling

## 2012-08-10 NOTE — Progress Notes (Signed)
Utilization review completed.  

## 2012-08-10 NOTE — Transfer of Care (Signed)
Immediate Anesthesia Transfer of Care Note  Patient: Kenneth Parker  Procedure(s) Performed: Procedure(s) (LRB) with comments: TOTAL KNEE REVISION (Right) - right total knee tibia and patella revision  Patient Location: PACU  Anesthesia Type: General  Level of Consciousness: awake, sedated and patient cooperative  Airway & Oxygen Therapy: Patient Spontanous Breathing and Patient connected to face mask oxygen  Post-op Assessment: Report given to PACU RN and Post -op Vital signs reviewed and stable  Post vital signs: Reviewed and stable  Complications: No apparent anesthesia complications

## 2012-08-10 NOTE — Brief Op Note (Signed)
08/10/2012  11:55 AM  PATIENT:  Kenneth Parker  71 y.o. 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  PROCEDURE:  Procedure(s) (LRB) with comments: TOTAL KNEE REVISION (Right) - right total knee tibia and patella revision  SURGEON:  Surgeon(s) and Role:    * Loanne Drilling, MD - Primary  PHYSICIAN ASSISTANT:   ASSISTANTS: Leilani Able, PA-C   ANESTHESIA:   general  EBL:  Total I/O In: 1000 [I.V.:1000] Out: -   BLOOD ADMINISTERED:none  DRAINS: (Medium) Hemovact drain(s) in the right knee with  Suction Open   LOCAL MEDICATIONS USED:  OTHER On-Q marcaine pain pump  COUNTS:  YES  TOURNIQUET:   Total Tourniquet Time Documented: Thigh (Right) - 54 minutes  DICTATION: .Other Dictation: Dictation Number [id number]  PLAN OF CARE: Admit to inpatient   PATIENT DISPOSITION:  PACU - hemodynamically stable.

## 2012-08-11 LAB — BASIC METABOLIC PANEL
BUN: 16 mg/dL (ref 6–23)
CO2: 25 mEq/L (ref 19–32)
Chloride: 99 mEq/L (ref 96–112)
Creatinine, Ser: 0.93 mg/dL (ref 0.50–1.35)
GFR calc Af Amer: >90 mL/min (ref >90)
Glucose, Bld: 125 mg/dL — ABNORMAL HIGH (ref 70–99)
Potassium: 4.2 mEq/L (ref 3.5–5.1)

## 2012-08-11 LAB — CBC
HCT: 36.4 % — ABNORMAL LOW (ref 39.0–52.0)
Hemoglobin: 12.5 g/dL — ABNORMAL LOW (ref 13.0–17.0)
MCV: 84.3 fL (ref 78.0–100.0)
RBC: 4.32 MIL/uL (ref 4.22–5.81)
WBC: 16.3 10*3/uL — ABNORMAL HIGH (ref 4.0–10.5)

## 2012-08-11 MED ORDER — MORPHINE SULFATE 2 MG/ML IJ SOLN
1.0000 mg | INTRAMUSCULAR | Status: DC | PRN
  Administered 2012-08-11 – 2012-08-12 (×3): 2 mg via INTRAVENOUS
  Filled 2012-08-11 (×3): qty 1

## 2012-08-11 MED ORDER — HYDROCHLOROTHIAZIDE 25 MG PO TABS
12.5000 mg | ORAL_TABLET | Freq: Every day | ORAL | Status: DC
  Filled 2012-08-11 (×2): qty 0.5

## 2012-08-11 MED ORDER — HYDROCHLOROTHIAZIDE 12.5 MG PO CAPS
12.5000 mg | ORAL_CAPSULE | Freq: Every day | ORAL | Status: DC
  Administered 2012-08-11 – 2012-08-13 (×3): 12.5 mg via ORAL
  Filled 2012-08-11 (×3): qty 1

## 2012-08-11 MED ORDER — LISINOPRIL 20 MG PO TABS
20.0000 mg | ORAL_TABLET | Freq: Every day | ORAL | Status: DC
  Administered 2012-08-11 – 2012-08-13 (×3): 20 mg via ORAL
  Filled 2012-08-11 (×4): qty 1

## 2012-08-11 NOTE — Progress Notes (Signed)
OT Screen Order received, chart reviewed. Spoke briefly with pt who stated that he has all necessary DME from previous knee sx and will have prn A from family. Pt presents with no OT needs at this time. Will sign off.  Garrel Ridgel, OTR/L  Pager [phone] 08/11/2012
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
## 2012-08-11 NOTE — Progress Notes (Addendum)
[name] spoke with pt concerning dc planning. Per pt choice [facility] to provide Hh services upon discharge. Pt states spouse is retired, will assist in home care. Pt states has DME for home use from previous surgery. [facility] notified of new referral. Md orders,H/P, and demographics faxed to [facility] at [phone]. [name] spoke to Intake rep Chasity, start of care confirmed upon patient's discharge. No other needs specified.   Kenneth Parker [phone]
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
## 2012-08-11 NOTE — Progress Notes (Signed)
Physical Therapy Treatment Patient Details Name: Kenneth Parker MRN: [id number] DOB: [birth year] Today's Date: 08/11/2012 Time: 1308-6578 PT Time Calculation (min): 16 min  PT Assessment / Plan / Recommendation Comments on Treatment Session  Pt assisted back to bed.  He maintained NWB on right leg due to bleeding this morning and desire to keep it from bleeding again    Follow Up Recommendations  Home health PT    Barriers to Discharge        Equipment Recommendations  None recommended by PT    Recommendations for Other Services    Frequency 7X/week   Plan      Precautions / Restrictions Restrictions Weight Bearing Restrictions: No Other Position/Activity Restrictions: WBAT LLE   Pertinent Vitals/Pain Pt reports pain in RLE and wanting to lie back down    Mobility  Bed Mobility Bed Mobility: Sitting - Scoot to Edge of Bed;Sit to Supine Supine to Sit: 4: Min assist Sitting - Scoot to Edge of Bed: 4: Min assist Sit to Supine: 4: Min assist Details for Bed Mobility Assistance: assis  to move right leg Transfers Transfers: Sit to Stand;Stand to Sit Sit to Stand: 4: Min assist Stand to Sit: 4: Min assist Details for Transfer Assistance: became lost balance on first attempt to stand and had to sit suddenly.  onl second attempt, pt was able to pivot to bed with RW maintaining NWB on RLE Ambulation/Gait Ambulation/Gait Assistance: 4: Min assist Ambulation Distance (Feet): 15 Feet Assistive device: Rolling walker Ambulation/Gait Assistance Details: pt does well with RW.  Stopped due to bleeding from incision Gait Pattern: Step-to pattern Gait velocity: WFL General Gait Details: pt appears comfortable with use of RW Stairs: No Wheelchair Mobility Wheelchair Mobility: No    Exercises Total Joint Exercises Ankle Circles/Pumps: AROM;Both;5 reps;Supine Quad Sets: AROM;Right;5 reps;Supine   PT Diagnosis: Difficulty walking;Abnormality of gait;Acute pain;Generalized weakness    PT Problem List: Decreased range of motion;Decreased activity tolerance;Decreased mobility;Decreased strength;Pain PT Treatment Interventions: DME instruction;Gait training;Stair training;Therapeutic exercise;Functional mobility training   PT Goals Acute Rehab PT Goals PT Goal Formulation: With patient Time For Goal Achievement: 08/18/12 Potential to Achieve Goals: Good Pt will go Supine/Side to Sit: Independently PT Goal: Supine/Side to Sit - Progress: Goal set today Pt will go Sit to Supine/Side: Independently PT Goal: Sit to Supine/Side - Progress: Goal set today Pt will go Stand to Sit: with modified independence PT Goal: Stand to Sit - Progress: Progressing toward goal Pt will Ambulate: 51 - 150 feet;with modified independence;with least restrictive assistive device PT Goal: Ambulate - Progress: Goal set today Pt will Go Up / Down Stairs: 3-5 stairs;with min assist;with least restrictive assistive device PT Goal: Up/Down Stairs - Progress: Goal set today Pt will Perform Home Exercise Program: with supervision, verbal cues required/provided PT Goal: Perform Home Exercise Program - Progress: Goal set today  Visit Information  Last PT Received On: 08/11/12 Assistance Needed: +1    Subjective Data  Subjective: "should I be checking it often to make sure its nots bleeding?" Patient Stated Goal: to go home as soon as possible   Cognition  Overall Cognitive Status: Appears within functional limits for tasks assessed/performed Arousal/Alertness: Awake/alert Orientation Level: Appears intact for tasks assessed Behavior During Session: [facility] for tasks performed    Balance  Balance Balance Assessed: Yes Static Sitting Balance Static Sitting - Balance Support: No upper extremity supported;Feet supported Static Sitting - Level of Assistance: 5: Stand by assistance Static Standing Balance Static Standing - Balance Support:  Bilateral upper extremity supported;During functional  activity Static Standing - Level of Assistance: 5: Stand by assistance  End of Session PT - End of Session Equipment Utilized During Treatment: Gait belt Activity Tolerance: Treatment limited secondary to medical complications (Comment);Other (comment) (bleeding from knee joint) Patient left: in bed;with family/visitor present Nurse Communication: Mobility status   GP     Rosey Bath K. Manson Passey, [place] [phone] 08/11/2012, 1:40 PM

## 2012-08-11 NOTE — Progress Notes (Signed)
   Subjective: 1 Day Post-Op Procedure(s) (LRB): TOTAL KNEE REVISION (Right) Patient reports pain as mild.   We will start therapy today.  Plan is to go Home after hospital stay.  Objective: Vital signs in last 24 hours: Temp:  [96.9 F (36.1 C)-98 F (36.7 C)] 97.9 F (36.6 C) (09/21 0435) Pulse Rate:  [59-79] 63  (09/21 0435) Resp:  [12-20] 16  (09/21 0435) BP: (146-184)/(77-95) 168/91 mmHg (09/21 0435) SpO2:  [95 %-99 %] 98 % (09/21 0435) Weight:  [96.616 kg (213 lb)] 96.616 kg (213 lb) (09/20 1944)  Intake/Output from previous day:  Intake/Output Summary (Last 24 hours) at 08/11/12 0818 Last data filed at 08/11/12 0600  Gross per 24 hour  Intake   3410 ml  Output   2520 ml  Net    890 ml    Intake/Output this shift:    Labs:  Basename 08/11/12 0524  HGB 12.5*    Basename 08/11/12 0524  WBC 16.3*  RBC 4.32  HCT 36.4*  PLT 187    Basename 08/11/12 0524  NA 134*  K 4.2  CL 99  CO2 25  BUN 16  CREATININE 0.93  GLUCOSE 125*  CALCIUM 8.8   No results found for this basename: LABPT:2,INR:2 in the last 72 hours  EXAM General - Patient is Alert, Appropriate and Oriented Extremity - Neurologically intact Neurovascular intact Incision: mild bloody drainage from hemovac site No cellulitis present Compartment soft Dressing - mild bloody drainage from hemovac site Motor Function - intact, moving foot and toes well on exam.  Hemovac pulled without difficulty.  Past Medical History  Diagnosis Date  . Hypertension   . Elevated cholesterol   . Vertigo     at times  . Pneumonia yrs ago  . Shingles 2011    left head and ear  . H/O hiatal hernia   . Frequency of urination   . Gout flare up now    left foot middle toe, drew paerkins pa saw area per pt  . Sleep apnea     STOPBANG=4    Assessment/Plan: 1 Day Post-Op Procedure(s) (LRB): TOTAL KNEE REVISION (Right) Principal Problem:  *Failed total knee replacement   Advance diet Up with  therapy D/C IV fluids Discharge home with home health probably on 9/23 but possibly 9/22 if doing very well  DVT Prophylaxis - Xarelto Weight-Bearing as tolerated to right leg D/C PCA, Change to IV push D/C O2 and Pulse OX and try on Room Air  Zophia Marrone V 08/11/2012, 8:18 AM

## 2012-08-12 LAB — BASIC METABOLIC PANEL
BUN: 20 mg/dL (ref 6–23)
Creatinine, Ser: 0.98 mg/dL (ref 0.50–1.35)
GFR calc Af Amer: >90 mL/min (ref >90)
GFR calc non Af Amer: 80 mL/min — ABNORMAL LOW (ref >90)
Glucose, Bld: 96 mg/dL (ref 70–99)
Potassium: 3.7 mEq/L (ref 3.5–5.1)

## 2012-08-12 LAB — CBC
HCT: 34 % — ABNORMAL LOW (ref 39.0–52.0)
Hemoglobin: 11.3 g/dL — ABNORMAL LOW (ref 13.0–17.0)
MCH: 28.3 pg (ref 26.0–34.0)
MCHC: 33.2 g/dL (ref 30.0–36.0)
MCV: 85 fL (ref 78.0–100.0)
RDW: 13.5 % (ref 11.5–15.5)

## 2012-08-12 NOTE — Progress Notes (Signed)
Physical Therapy Treatment Patient Details Name: Kenneth Parker MRN: [id number] DOB: September 26, 1941 Today's Date: 08/12/2012 Time: 3086-5784 PT Time Calculation (min): 12 min  PT Assessment / Plan / Recommendation Comments on Treatment Session  Pt with uncontrolled pain this am and this pm he is having much difficulty with urination.  He was able to walk with KI in place, but felt weak.  Nurse in room continuing to care for pt.    Follow Up Recommendations  Home health PT    Barriers to Discharge        Equipment Recommendations       Recommendations for Other Services    Frequency     Plan Discharge plan remains appropriate;Frequency remains appropriate    Precautions / Restrictions  Pt with uncontrolled pain in knee   Pertinent Vitals/Pain    Mobility  Bed Mobility Details for Bed Mobility Assistance: pt up in bathroom Transfers Transfers: Sit to Stand;Stand to Sit Sit to Stand: 4: Min assist Stand to Sit: 4: Min assist Details for Transfer Assistance: became lost balance on first attempt to stand and had to sit suddenly.  onl second attempt, pt was able to pivot to bed with RW maintaining NWB on RLE Ambulation/Gait Ambulation/Gait Assistance: 4: Min assist Ambulation Distance (Feet): 150 Feet Assistive device: Rolling walker Gait Pattern: Antalgic;Step-to pattern;Right flexed knee in stance Gait velocity: decerased  General Gait Details: Gait difficult today due to increased pain in leg.  Stairs: No Wheelchair Mobility Wheelchair Mobility: No    Exercises     PT Diagnosis:    PT Problem List:   PT Treatment Interventions:     PT Goals Acute Rehab PT Goals PT Goal: Stand to Sit - Progress: Progressing toward goal PT Goal: Ambulate - Progress: Progressing toward goal  Visit Information  Last PT Received On: 08/12/12 Assistance Needed: +1    Subjective Data  Subjective:  Its a 10 PLUS!! Patient Stated Goal: to get pain under control    Cognition  Overall  Cognitive Status: Appears within functional limits for tasks assessed/performed Arousal/Alertness: Awake/alert Orientation Level: Appears intact for tasks assessed Behavior During Session: [facility] for tasks performed    Balance     End of Session PT - End of Session Equipment Utilized During Treatment: Right knee immobilizer;Gait belt Activity Tolerance: Patient limited by pain Patient left: with nursing in room Nurse Communication: Mobility status   GP    Rosey Bath K. Brown,PT [phone] 08/12/2012, 1:54 PM

## 2012-08-12 NOTE — Progress Notes (Signed)
Physical Therapy Treatment Patient Details Name: Shadee E [place] MRN: [id number] DOB: 1940-12-18 Today's Date: 08/12/2012 Time: 1600-1630 PT Time Calculation (min): 30 min  PT Assessment / Plan / Recommendation Comments on Treatment Session  Pt continues with uncontrolled pain.  Pt agreed to try exercise, but needed to walk to bathroom first. KI in place for that to protect knee.  Once back to bed, attempted ROM.  Knee warm and swollen.  Pt only able to tolerate min ROM and was unable to generate much of a quad set.  cold pack applies and nurse notified    Follow Up Recommendations  Home health PT    Barriers to Discharge        Equipment Recommendations       Recommendations for Other Services    Frequency     Plan Discharge plan remains appropriate;Frequency remains appropriate    Precautions / Restrictions Precautions Required Braces or Orthoses: Knee Immobilizer - Right   Pertinent Vitals/Pain 10+ pain in knee    Mobility  Bed Mobility Bed Mobility: Supine to Sit Supine to Sit: 4: Min assist Details for Bed Mobility Assistance: assist for RLE Transfers Transfers: Sit to Stand;Stand to Sit Sit to Stand: 5: Supervision Stand to Sit: 5: Supervision Details for Transfer Assistance: became lost balance on first attempt to stand and had to sit suddenly.  onl second attempt, pt was able to pivot to bed with RW maintaining NWB on RLE Ambulation/Gait Ambulation/Gait Assistance: 5: Supervision Ambulation Distance (Feet): 10 Feet Assistive device: Rolling walker Gait Pattern: Antalgic;Step-to pattern;Right flexed knee in stance Gait velocity: decerased  General Gait Details: Gait difficult today due to increased pain in leg.  Stairs: No Wheelchair Mobility Wheelchair Mobility: No    Exercises Total Joint Exercises Ankle Circles/Pumps: AROM;Right;10 reps;Supine Quad Sets: AROM;Right;10 reps;Supine Short Arc Quad: AAROM;Right;10 reps;Supine Long Arc Quad: AAROM;Right;5  reps;Seated Knee Flexion: AROM;Right;5 reps;Seated   PT Diagnosis:    PT Problem List:   PT Treatment Interventions:     PT Goals Acute Rehab PT Goals PT Goal: Supine/Side to Sit - Progress: Progressing toward goal PT Goal: Sit to Supine/Side - Progress: Progressing toward goal PT Goal: Stand to Sit - Progress: Progressing toward goal PT Goal: Ambulate - Progress: Progressing toward goal PT Goal: Perform Home Exercise Program - Progress: Not progressing (due to pain and swelling)  Visit Information  Last PT Received On: 08/12/12 Assistance Needed: +1    Subjective Data  Subjective: "I have to pee first"  Pt reports pain has not improved at all Patient Stated Goal: to get pain under control    Cognition  Overall Cognitive Status: Appears within functional limits for tasks assessed/performed Arousal/Alertness: Awake/alert Orientation Level: Appears intact for tasks assessed Behavior During Session: [facility] for tasks performed    Balance     End of Session PT - End of Session Equipment Utilized During Treatment: Right knee immobilizer;Gait belt Activity Tolerance: Patient limited by pain Patient left: in bed;with call bell/phone within reach Nurse Communication: Patient requests pain meds   GP     Rosey Bath K. Manson Passey, PT 08/12/2012, 4:53 PM

## 2012-08-12 NOTE — Progress Notes (Signed)
Kenneth Parker  MRN: [id number] DOB/Age: [birth year] 73 y.o. Physician: Jacquelyne Balint Procedure: Procedure(s) (LRB): TOTAL KNEE REVISION (Right)     Subjective: In a lot of pain at moment after getting up to bathroom. Had BM this am  Vital Signs Temp:  [97.5 F (36.4 C)-98.3 F (36.8 C)] 98.1 F (36.7 C) (09/22 0630) Pulse Rate:  [59-69] 68  (09/22 0630) Resp:  [16-20] 20  (09/22 0630) BP: (127-156)/(68-76) 134/76 mmHg (09/22 0630) SpO2:  [92 %-95 %] 92 % (09/22 0630)  Lab Results  Basename 08/12/12 0501 08/11/12 0524  WBC 12.9* 16.3*  HGB 11.3* 12.5*  HCT 34.0* 36.4*  PLT 144* 187   BMET  Basename 08/12/12 0501 08/11/12 0524  NA 135 134*  K 3.7 4.2  CL 101 99  CO2 26 25  GLUCOSE 96 125*  BUN 20 16  CREATININE 0.98 0.93  CALCIUM 8.6 8.8   INR  Date Value Range Status  08/02/2012 1.15  0.00 - 1.49 Final     Exam Dressing with bloody drainage at distal incision. Had a lot of bleeding yesterday he says. No active currently        Plan Continue present po , supplementing with IV push at moment. If better pain control home tomorrow  Dressing changed  [name] for Dr.Kevin Supple 08/12/2012, 7:44 AM

## 2012-08-12 NOTE — Plan of Care (Signed)
Problem: Phase III Progression Outcomes Goal: Anticoagulant follow-up in place Outcome: Not Applicable Date Met:  08/12/12 xarelto

## 2012-08-13 ENCOUNTER — Encounter (HOSPITAL_COMMUNITY): Payer: Self-pay | Admitting: Orthopedic Surgery

## 2012-08-13 LAB — CBC
HCT: 35.6 % — ABNORMAL LOW (ref 39.0–52.0)
Hemoglobin: 12 g/dL — ABNORMAL LOW (ref 13.0–17.0)
MCHC: 33.7 g/dL (ref 30.0–36.0)
RBC: 4.17 MIL/uL — ABNORMAL LOW (ref 4.22–5.81)
WBC: 12.9 10*3/uL — ABNORMAL HIGH (ref 4.0–10.5)

## 2012-08-13 MED ORDER — OMEPRAZOLE 20 MG PO CPDR
20.0000 mg | DELAYED_RELEASE_CAPSULE | Freq: Every day | ORAL | Status: DC
  Administered 2012-08-13: 20 mg via ORAL
  Filled 2012-08-13: qty 1

## 2012-08-13 MED ORDER — OXYCODONE HCL 5 MG PO TABS
5.0000 mg | ORAL_TABLET | ORAL | Status: DC | PRN
  Administered 2012-08-13: 10 mg via ORAL
  Administered 2012-08-13: 15 mg via ORAL
  Filled 2012-08-13: qty 3
  Filled 2012-08-13: qty 2

## 2012-08-13 MED ORDER — OXYCODONE HCL 5 MG PO TABS: 5.0000 mg | ORAL_TABLET | ORAL | Status: DC | PRN

## 2012-08-13 MED ORDER — METHOCARBAMOL 500 MG PO TABS: 500.0000 mg | ORAL_TABLET | Freq: Four times a day (QID) | ORAL | Status: DC | PRN

## 2012-08-13 MED ORDER — RIVAROXABAN 10 MG PO TABS: 10.0000 mg | ORAL_TABLET | Freq: Every day | ORAL | Status: DC

## 2012-08-13 MED ORDER — NON FORMULARY: 20.0000 mg | Freq: Every day | Status: DC
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
## 2012-08-13 NOTE — Progress Notes (Signed)
Physical Therapy Treatment Patient Details Name: Kenneth Parker MRN: [id number] DOB: 1941/10/30 Today's Date: 08/13/2012 Time: 0855-0909 PT Time Calculation (min): 14 min  PT Assessment / Plan / Recommendation Comments on Treatment Session  Pt reports feeling weak today but pain is better than yesterday.  Pt ambulated in hallway and performed stairs.  Possible d/c home today.  Pt had no questions/concerns about d/c home.    Follow Up Recommendations  Home health PT    Barriers to Discharge        Equipment Recommendations  None recommended by PT    Recommendations for Other Services    Frequency     Plan Discharge plan remains appropriate;Frequency remains appropriate    Precautions / Restrictions Precautions Precautions: Knee Required Braces or Orthoses: Knee Immobilizer - Right   Pertinent Vitals/Pain 5/10 R knee pain, repositioned, ice applied    Mobility  Bed Mobility Bed Mobility: Supine to Sit Supine to Sit: 5: Supervision Details for Bed Mobility Assistance: use of UEs to assist R LE Transfers Transfers: Sit to Stand;Stand to Sit Sit to Stand: 5: Supervision Stand to Sit: 5: Supervision Details for Transfer Assistance: verbal cues for hand placement Ambulation/Gait Ambulation/Gait Assistance: 5: Supervision Ambulation Distance (Feet): 120 Feet Assistive device: Rolling walker Ambulation/Gait Assistance Details: able to WBAT today with 5/10 pain during ambulation Gait Pattern: Step-through pattern;Antalgic Gait velocity: decreased Stairs: Yes Stairs Assistance: 4: Min guard Stairs Assistance Details (indicate cue type and reason): pt reports wall on L side so used 2 rails for technique, verbal cue for sequence Stair Management Technique: Two rails;Step to pattern;Backwards Number of Stairs: 3     Exercises     PT Diagnosis:    PT Problem List:   PT Treatment Interventions:     PT Goals Acute Rehab PT Goals PT Goal: Supine/Side to Sit - Progress:  Progressing toward goal PT Goal: Stand to Sit - Progress: Progressing toward goal PT Goal: Ambulate - Progress: Progressing toward goal PT Goal: Up/Down Stairs - Progress: Met  Visit Information  Last PT Received On: 08/13/12 Assistance Needed: +1    Subjective Data  Subjective: I'm HOH.   Cognition  Overall Cognitive Status: Appears within functional limits for tasks assessed/performed    Balance     End of Session PT - End of Session Equipment Utilized During Treatment: Right knee immobilizer;Gait belt Activity Tolerance: Patient limited by fatigue;Patient limited by pain Patient left: in chair;with call bell/phone within reach   GP     [facility] E 08/13/2012, 10:23 AM Pager: [phone]

## 2012-08-13 NOTE — Progress Notes (Signed)
CARE MANAGEMENT NOTE 08/13/2012  Patient:  STEEVEN, HARSHMAN   Account Number:  [id number]  Date Initiated:  08/12/2012  Documentation initiated by:  DAVIS,TYMEEKA  Subjective/Objective Assessment:   71 YO MALE ADMITTED S/P TOTAL KNEE REVISION (Right). PTA PT LIVED HOME WITH SPOUSE.     Action/Plan:   HOME WHEN STABLE   Anticipated DC Date:  08/13/2012   Anticipated DC Plan:  HOME W HOME HEALTH SERVICES  In-house referral  NA      DC Planning Services  CM consult      [facility] Choice  HOME HEALTH   Choice offered to / List presented to:  C-1 Patient   DME arranged  NA      DME agency  NA     HH arranged  HH-2 PT      HH agency  [facility]   Status of service:  Completed, signed off Medicare Important Message given?  NA - LOS <3 / Initial given by admissions (If response is "NO", the following Medicare IM given date fields will be blank) Date Medicare IM given:   Date Additional Medicare IM given:    Discharge Disposition:  HOME W HOME HEALTH SERVICES   Comments:  08/13/2012 Raynelle Bring BSN CCM [phone] Pt discharged today. [facility] notified and start of care will be 08/13/2012.

## 2012-08-13 NOTE — Progress Notes (Signed)
   Subjective: 3 Days Post-Op Procedure(s) (LRB): TOTAL KNEE REVISION (Right) Patient reports pain as mild and moderate.   Patient seen in rounds by Dr. Lequita Halt.  He is doing better at this point.  Patient is well, and has had no acute complaints or problems Patient is ready to go go home.  Objective: Vital signs in last 24 hours: Temp:  [98.1 F (36.7 C)-99.8 F (37.7 C)] 98.1 F (36.7 C) (09/23 0515) Pulse Rate:  [79-92] 79  (09/23 0515) Resp:  [18-20] 20  (09/23 0515) BP: (117-148)/(70-77) 117/70 mmHg (09/23 0515) SpO2:  [92 %-98 %] 92 % (09/23 0515)  Intake/Output from previous day:  Intake/Output Summary (Last 24 hours) at 08/13/12 0708 Last data filed at 08/13/12 0515  Gross per 24 hour  Intake   1135 ml  Output   2500 ml  Net  -1365 ml    Intake/Output this shift:    Labs:  Basename 08/13/12 0401 08/12/12 0501 08/11/12 0524  HGB 12.0* 11.3* 12.5*    Basename 08/13/12 0401 08/12/12 0501  WBC 12.9* 12.9*  RBC 4.17* 4.00*  HCT 35.6* 34.0*  PLT 165 144*    Basename 08/12/12 0501 08/11/12 0524  NA 135 134*  K 3.7 4.2  CL 101 99  CO2 26 25  BUN 20 16  CREATININE 0.98 0.93  GLUCOSE 96 125*  CALCIUM 8.6 8.8   No results found for this basename: LABPT:2,INR:2 in the last 72 hours  EXAM: General - Patient is Alert, Appropriate and Oriented Extremity - Neurovascular intact Sensation intact distally Incision - clean, dry, no drainage, healing Motor Function - intact, moving foot and toes well on exam.   Assessment/Plan: 3 Days Post-Op Procedure(s) (LRB): TOTAL KNEE REVISION (Right) Procedure(s) (LRB): TOTAL KNEE REVISION (Right) Past Medical History  Diagnosis Date  . Hypertension   . Elevated cholesterol   . Vertigo     at times  . Pneumonia yrs ago  . Shingles 2011    left head and ear  . H/O hiatal hernia   . Frequency of urination   . Gout flare up now      . Sleep apnea     STOPBANG=4   Principal Problem:  *Failed total knee  replacement   Discharge home with home health Diet - Cardiac diet Follow up - in 2 weeks Activity - WBAT Disposition - Home Condition Upon Discharge - Good D/C Meds - See DC Summary DVT Prophylaxis - Xarelto  PERKINS, ALEXZANDREW 08/13/2012, 7:08 AM

## 2012-08-14 LAB — BODY FLUID CULTURE
Culture: NO GROWTH
Gram Stain: NONE SEEN

## 2012-08-15 LAB — ANAEROBIC CULTURE

## 2015-09-24 ENCOUNTER — Other Ambulatory Visit (HOSPITAL_COMMUNITY): Payer: Self-pay | Admitting: Orthopedic Surgery

## 2015-09-24 DIAGNOSIS — M25561 Pain in right knee: Secondary | ICD-10-CM

## 2015-10-07 ENCOUNTER — Encounter (HOSPITAL_COMMUNITY)
Admission: RE | Admit: 2015-10-07 | Discharge: 2015-10-07 | Disposition: A | Discharge status: Home / Self Care | Payer: Medicare (Managed Care) | Source: Ambulatory Visit | Attending: Orthopedic Surgery | Admitting: Orthopedic Surgery

## 2015-10-07 DIAGNOSIS — M25561 Pain in right knee: Secondary | ICD-10-CM

## 2015-10-07 DIAGNOSIS — Z96651 Presence of right artificial knee joint: Secondary | ICD-10-CM | POA: Insufficient documentation

## 2015-10-07 MED ORDER — TECHNETIUM TC 99M MEDRONATE IV KIT
25.0000 | PACK | Freq: Once | INTRAVENOUS | Status: AC | PRN
  Administered 2015-10-07: 26.5 via INTRAVENOUS

## 2016-02-18 ENCOUNTER — Ambulatory Visit: Payer: Self-pay | Admitting: Orthopedic Surgery

## 2016-02-18 NOTE — Progress Notes (Signed)
Preoperative surgical orders have been place into the Epic hospital system for Kenneth Parker on 02/18/2016, 4:42 PM  by Patrica DuelPERKINS, Liann Spaeth for surgery on 03-02-16.  Preop Knee orders including Experal, IV Tylenol, and IV Decadron as long as there are no contraindications to the above medications. Avel Peacerew Caitrin Pendergraph, PA-C

## 2016-02-22 ENCOUNTER — Encounter (HOSPITAL_COMMUNITY): Payer: Self-pay

## 2016-02-22 ENCOUNTER — Encounter (HOSPITAL_COMMUNITY)
Admission: RE | Admit: 2016-02-22 | Discharge: 2016-02-22 | Disposition: A | Discharge status: Home / Self Care | Payer: Medicare Other | Source: Ambulatory Visit | Attending: Orthopedic Surgery | Admitting: Orthopedic Surgery

## 2016-02-22 DIAGNOSIS — T84032A Mechanical loosening of internal right knee prosthetic joint, initial encounter: Secondary | ICD-10-CM | POA: Diagnosis not present

## 2016-02-22 DIAGNOSIS — Z01812 Encounter for preprocedural laboratory examination: Secondary | ICD-10-CM | POA: Insufficient documentation

## 2016-02-22 DIAGNOSIS — X58XXXA Exposure to other specified factors, initial encounter: Secondary | ICD-10-CM | POA: Diagnosis not present

## 2016-02-22 HISTORY — DX: Unspecified cataract: H26.9

## 2016-02-22 HISTORY — DX: Headache, unspecified: R51.9

## 2016-02-22 HISTORY — DX: Gastro-esophageal reflux disease without esophagitis: K21.9

## 2016-02-22 HISTORY — DX: Headache: R51

## 2016-02-22 HISTORY — DX: Unspecified glaucoma: H40.9

## 2016-02-22 LAB — COMPREHENSIVE METABOLIC PANEL
ALBUMIN: 4.6 g/dL (ref 3.5–5.0)
ALK PHOS: 66 U/L (ref 38–126)
ALT: 23 U/L (ref 17–63)
ANION GAP: 10 (ref 5–15)
AST: 21 U/L (ref 15–41)
BILIRUBIN TOTAL: 1.3 mg/dL — AB (ref 0.3–1.2)
BUN: 15 mg/dL (ref 6–20)
CALCIUM: 9.9 mg/dL (ref 8.9–10.3)
CO2: 27 mmol/L (ref 22–32)
Chloride: 104 mmol/L (ref 101–111)
Creatinine, Ser: 1.04 mg/dL (ref 0.61–1.24)
GFR calc Af Amer: >60 mL/min (ref >60)
GLUCOSE: 99 mg/dL (ref 65–99)
POTASSIUM: 4.7 mmol/L (ref 3.5–5.1)
Sodium: 141 mmol/L (ref 135–145)
TOTAL PROTEIN: 7.8 g/dL (ref 6.5–8.1)

## 2016-02-22 LAB — CBC
HEMATOCRIT: 46.5 % (ref 39.0–52.0)
HEMOGLOBIN: 15.8 g/dL (ref 13.0–17.0)
MCH: 28.6 pg (ref 26.0–34.0)
MCHC: 34 g/dL (ref 30.0–36.0)
MCV: 84.1 fL (ref 78.0–100.0)
Platelets: 199 10*3/uL (ref 150–400)
RBC: 5.53 MIL/uL (ref 4.22–5.81)
RDW: 13.8 % (ref 11.5–15.5)
WBC: 12.9 10*3/uL — ABNORMAL HIGH (ref 4.0–10.5)

## 2016-02-22 LAB — URINALYSIS, ROUTINE W REFLEX MICROSCOPIC
Bilirubin Urine: NEGATIVE
Glucose, UA: NEGATIVE mg/dL
HGB URINE DIPSTICK: NEGATIVE
Ketones, ur: NEGATIVE mg/dL
Leukocytes, UA: NEGATIVE
NITRITE: NEGATIVE
PH: 6 (ref 5.0–8.0)
Protein, ur: NEGATIVE mg/dL
SPECIFIC GRAVITY, URINE: 1.006 (ref 1.005–1.030)

## 2016-02-22 LAB — SURGICAL PCR SCREEN
MRSA, PCR: NEGATIVE
Staphylococcus aureus: NEGATIVE

## 2016-02-22 LAB — PROTIME-INR
INR: 1.12 (ref 0.00–1.49)
PROTHROMBIN TIME: 14.6 s (ref 11.6–15.2)

## 2016-02-22 LAB — APTT: aPTT: 38 seconds — ABNORMAL HIGH (ref 24–37)

## 2016-02-22 NOTE — Patient Instructions (Addendum)
Kenneth Parker  02/22/2016   Your procedure is scheduled on: 03-02-16  [facility]  [facility]  [facility]vators to 3rd floor to  Short Stay Center at  1230  PM  Call this number if you have problems the morning of surgery [phone]   Remember: ONLY 1 PERSON MAY GO WITH YOU TO SHORT STAY TO GET  READY MORNING OF YOUR SURGERY.  Do not eat food :After Midnight, clear liquids midnight until 830 am day of surgery, nothingy by mouth after 830 am day of surgery.      Take these medicines the morning of surgery with A SIP OF WATER:  ALLOPURINOL, OMEPRAZOLE (PRILOSEC)              You may not have any metal on your body including hair pins and              piercings  Do not wear jewelry, make-up, lotions, powders or perfumes, deodorant             Do not wear nail polish.  Do not shave  48 hours prior to surgery.              Men may shave face and neck.   Do not bring valuables to the hospital. [facility] IS NOT             RESPONSIBLE   FOR VALUABLES.  Contacts, dentures or bridgework may not be worn into surgery.  Leave suitcase in the car. After surgery it may be brought to your room.     Patients discharged the day of surgery will not be allowed to drive home.  Name and phone number of your driver:  Special Instructions: N/A              Please read over the following fact sheets you were given: _____________________________________________________________________                CLEAR LIQUID DIET   Foods Allowed                                                                     Foods Excluded  Coffee and tea, regular and decaf                             liquids that you cannot  Plain Jell-O in any flavor                                             see through such as: Fruit ices (not with fruit pulp)                                     milk, soups, orange juice  Iced Popsicles  All solid  food Carbonated beverages, regular and diet                                    Cranberry, grape and apple juices Sports drinks like Gatorade Lightly seasoned clear broth or consume(fat free) Sugar, honey syrup  Sample Menu Breakfast                                Lunch                                     Supper Cranberry juice                    Beef broth                            Chicken broth Jell-O                                     Grape juice                           Apple juice Coffee or tea                        Jell-O                                      Popsicle                                                Coffee or tea                        Coffee or tea  _____________________________________________________________________  [facility] - Preparing for Surgery Before surgery, you can play an important role.  Because skin is not sterile, your skin needs to be as free of germs as possible.  You can reduce the number of germs on your skin by washing with CHG (chlorahexidine gluconate) soap before surgery.  CHG is an antiseptic cleaner which kills germs and bonds with the skin to continue killing germs even after washing. Please DO NOT use if you have an allergy to CHG or antibacterial soaps.  If your skin becomes reddened/irritated stop using the CHG and inform your nurse when you arrive at Short Stay. Do not shave (including legs and underarms) for at least 48 hours prior to the first CHG shower.  You may shave your face/neck. Please follow these instructions carefully:  1.  Shower with CHG Soap the night before surgery and the  morning of Surgery.  2.  If you choose to wash your hair, wash your hair first as usual with your  normal  shampoo.  3.  After you shampoo, rinse your hair and body thoroughly to remove the  shampoo.  4.  Use CHG as you would any other liquid soap.  You can apply chg directly  to the skin and wash                       Gently  with a scrungie or clean washcloth.  5.  Apply the CHG Soap to your body ONLY FROM THE NECK DOWN.   Do not use on face/ open                           Wound or open sores. Avoid contact with eyes, ears mouth and genitals (private parts).                       Wash face,  Genitals (private parts) with your normal soap.             6.  Wash thoroughly, paying special attention to the area where your surgery  will be performed.  7.  Thoroughly rinse your body with warm water from the neck down.  8.  DO NOT shower/wash with your normal soap after using and rinsing off  the CHG Soap.                9.  Pat yourself dry with a clean towel.            10.  Wear clean pajamas.            11.  Place clean sheets on your bed the night of your first shower and do not  sleep with pets. Day of Surgery : Do not apply any lotions/deodorants the morning of surgery.  Please wear clean clothes to the hospital/surgery center.  FAILURE TO FOLLOW THESE INSTRUCTIONS MAY RESULT IN THE CANCELLATION OF YOUR SURGERY PATIENT SIGNATURE_________________________________  NURSE SIGNATURE__________________________________  ________________________________________________________________________   Kenneth Parker  An incentive spirometer is a tool that can help keep your lungs clear and active. This tool measures how well you are filling your lungs with each breath. Taking long deep breaths may help reverse or decrease the chance of developing breathing (pulmonary) problems (especially infection) following:  A long period of time when you are unable to move or be active. BEFORE THE PROCEDURE   If the spirometer includes an indicator to show your best effort, your nurse or respiratory therapist will set it to a desired goal.  If possible, sit up straight or lean slightly forward. Try not to slouch.  Hold the incentive spirometer in an upright position. INSTRUCTIONS FOR USE   Sit on the edge of your bed if  possible, or sit up as far as you can in bed or on a chair.  Hold the incentive spirometer in an upright position.  Breathe out normally.  Place the mouthpiece in your mouth and seal your lips tightly around it.  Breathe in slowly and as deeply as possible, raising the piston or the ball toward the top of the column.  Hold your breath for 3-5 seconds or for as long as possible. Allow the piston or ball to fall to the bottom of the column.  Remove the mouthpiece from your mouth and breathe out normally.  Rest for a few seconds and repeat Steps 1 through 7 at least 10 times every 1-2 hours when you are awake. Take your time and take a few normal breaths between deep breaths.  The spirometer may include an indicator to  show your best effort. Use the indicator as a goal to work toward during each repetition.  After each set of 10 deep breaths, practice coughing to be sure your lungs are clear. If you have an incision (the cut made at the time of surgery), support your incision when coughing by placing a pillow or rolled up towels firmly against it. Once you are able to get out of bed, walk around indoors and cough well. You may stop using the incentive spirometer when instructed by your caregiver.  RISKS AND COMPLICATIONS  Take your time so you do not get dizzy or light-headed.  If you are in pain, you may need to take or ask for pain medication before doing incentive spirometry. It is harder to take a deep breath if you are having pain. AFTER USE  Rest and breathe slowly and easily.  It can be helpful to keep track of a log of your progress. Your caregiver can provide you with a simple table to help with this. If you are using the spirometer at home, follow these instructions: [place] IF:   You are having difficultly using the spirometer.  You have trouble using the spirometer as often as instructed.  Your pain medication is not giving enough relief while using the  spirometer.  You develop fever of 100.5 F (38.1 C) or higher. SEEK IMMEDIATE MEDICAL CARE IF:   You cough up bloody sputum that had not been present before.  You develop fever of 102 F (38.9 C) or greater.  You develop worsening pain at or near the incision site. MAKE SURE YOU:   Understand these instructions.  Will watch your condition.  Will get help right away if you are not doing well or get worse. Document Released: 03/20/2007 Document Revised: 01/30/2012 Document Reviewed: 05/21/2007 Exit[employer] Patient Information 2014 Exit[employer], [place].   ________________________________________________________________________  WHAT IS A BLOOD TRANSFUSION? Blood Transfusion Information  A transfusion is the replacement of blood or some of its parts. Blood is made up of multiple cells which provide different functions.  Red blood cells carry oxygen and are used for blood loss replacement.  White blood cells fight against infection.  Platelets control bleeding.  Plasma helps clot blood.  Other blood products are available for specialized needs, such as hemophilia or other clotting disorders. BEFORE THE TRANSFUSION  Who gives blood for transfusions?   Healthy volunteers who are fully evaluated to make sure their blood is safe. This is blood bank blood. Transfusion therapy is the safest it has ever been in the practice of medicine. Before blood is taken from a donor, a complete history is taken to make sure that person has no history of diseases nor engages in risky social behavior (examples are intravenous drug use or sexual activity with multiple partners). The donor's travel history is screened to minimize risk of transmitting infections, such as malaria. The donated blood is tested for signs of infectious diseases, such as HIV and hepatitis. The blood is then tested to be sure it is compatible with you in order to minimize the chance of a transfusion reaction. If you or a relative  donates blood, this is often done in anticipation of surgery and is not appropriate for emergency situations. It takes many days to process the donated blood. RISKS AND COMPLICATIONS Although transfusion therapy is very safe and saves many lives, the main dangers of transfusion include:   Getting an infectious disease.  Developing a transfusion reaction. This is an allergic reaction  to something in the blood you were given. Every precaution is taken to prevent this. The decision to have a blood transfusion has been considered carefully by your caregiver before blood is given. Blood is not given unless the benefits outweigh the risks. AFTER THE TRANSFUSION  Right after receiving a blood transfusion, you will usually feel much better and more energetic. This is especially true if your red blood cells have gotten low (anemic). The transfusion raises the level of the red blood cells which carry oxygen, and this usually causes an energy increase.  The nurse administering the transfusion will monitor you carefully for complications. HOME CARE INSTRUCTIONS  No special instructions are needed after a transfusion. You may find your energy is better. Speak with your caregiver about any limitations on activity for underlying diseases you may have. SEEK MEDICAL CARE IF:   Your condition is not improving after your transfusion.  You develop redness or irritation at the intravenous (IV) site. SEEK IMMEDIATE MEDICAL CARE IF:  Any of the following symptoms occur over the next 12 hours:  Shaking chills.  You have a temperature by mouth above 102 F (38.9 C), not controlled by medicine.  Chest, back, or muscle pain.  People around you feel you are not acting correctly or are confused.  Shortness of breath or difficulty breathing.  Dizziness and fainting.  You get a rash or develop hives.  You have a decrease in urine output.  Your urine turns a dark color or changes to pink, red, or brown. Any  of the following symptoms occur over the next 10 days:  You have a temperature by mouth above 102 F (38.9 C), not controlled by medicine.  Shortness of breath.  Weakness after normal activity.  The white part of the eye turns yellow (jaundice).  You have a decrease in the amount of urine or are urinating less often.  Your urine turns a dark color or changes to pink, red, or brown. Document Released: 11/04/2000 Document Revised: 01/30/2012 Document Reviewed: 06/23/2008 [facility] Patient Information 2014 [place], [place].  _______________________________________________________________________

## 2016-03-01 ENCOUNTER — Ambulatory Visit: Payer: Self-pay | Admitting: Orthopedic Surgery

## 2016-03-01 NOTE — H&P (Signed)
Kenneth Parker DOB: 01/24/1941 Married / Language: English / Race: White Male Date of Admission:  03/02/16 CC:  Loose Right Total Knee History of Present Illness The patient is a 75 year old male who comes in for a preoperative History and Physical. The patient is scheduled for a right femoral revision versus total knee revision to be performed by Dr. Frank V. Aluisio, MD at [facility] on 03/02/2016. The patient is a 75 year old male who presented for follow up of their knee. The patient is being followed for their right knee pain. They are now 3 year(s) out from total knee revision. Symptoms reported include: pain, aching and difficulty ambulating. The patient feels that they are doing poorly. The following medication has been used for pain control: none. The patient presented following bone scan. He continues with the lateral sided discomfort and weightbearing pain. It is worse when he first gets up and after he is walking for a while it start bothering him again. When I aspirated the last time he said his pain has increased since. We reviewed the bone scan and he does have increased uptake, especially around the femoral component and somewhat around the patellar component. The tibial component shows a tiny bit of increased uptake proximally, but nothing around the stem. 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 At this point, the most predictable means of improving pain and function is revision total knee arthroplasty. The procedure, risks, potential complications and rehab course are discussed in detail and the patient elects to proceed. The tibial component looks stable on the bone scan. He said this is somewhat debilitating and is now admitted for surgical intervention. They have been treated conservatively in the past for the above stated problem and despite conservative measures, they continue to have progressive  pain and severe functional limitations and dysfunction. They have failed non-operative management including home exercise, medications. It is felt that they would benefit from undergoing revision of the total joint replacement. Risks and benefits of the procedure have been discussed with the patient and they elect to proceed with surgery. There are no active contraindications to surgery such as ongoing infection or rapidly progressive neurological disease.  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 age 62 Mother  Deceased, Congestive Heart Failure. age 85  Social History Living situation  live with spouse Marital status  married Illicit drug use  no Drug/Alcohol Rehab (Previously)  no Exercise  Exercises daily; does running / walking Tobacco use  former smoker; smoke(d) 1 pack(s) per day Pain Contract  no Tobacco / smoke exposure  no Drug/Alcohol Rehab (Currently)  no Children  3 Current work status  retired Post-Surgical Plans  Plan is to go home Alcohol use  former drinker; quit 1968  Medication History  Lisinopril-Hydrochlorothiazide (20-12.5MG Tablet, Oral) Active. Omeprazole (20MG Tablet DR, Oral) Active. Simvastatin (20MG Tablet, Oral) Active. Allopurinol (100MG Tablet, Oral) Active. Vitamin C ER (  500MG Capsule ER, Oral) Active. Fish Oil Burp-Less (1000MG Capsule, Oral) Active. Aspirin (81MG  Tablet, Oral) Active. Amoxicillin (500MG Capsule, Oral) Active. (dental protocol) Lidocaine (5% Patch, External) Active. Latanoprost (0.005% Solution, Ophthalmic) Active. Meclizine HCl (25MG Tablet, Oral as needed) Active. DiphenhydrAMINE HCl (25MG Capsule, Oral as needed) Active. Butalbital-APAP-Caffeine (50-325-40MG Tablet, Oral as needed) Active.  Past Surgical History Total Knee Replacement  Date: 05/11/2009. right Hemorrhoidectomy  Date: 2011.   Review of Systems General Not Present- Chills, Fatigue, Fever, Memory Loss, Night Sweats, Weight Gain and Weight Loss. Skin Present- Hives (none recently). Not Present- Eczema, Itching, Lesions and Rash. HEENT Present- Hearing Loss. Not Present- Dentures, Double Vision, Headache, Tinnitus and Visual Loss. Respiratory Not Present- Allergies, Chronic Cough, Coughing up blood, Shortness of breath at rest and Shortness of breath with exertion. Cardiovascular Not Present- Chest Pain, Difficulty Breathing Lying Down, Murmur, Palpitations, Racing/skipping heartbeats and Swelling. Gastrointestinal Not Present- Abdominal Pain, Bloody Stool, Constipation, Diarrhea, Difficulty Swallowing, Heartburn, Jaundice, Loss of appetitie, Nausea and Vomiting. Male Genitourinary Present- Urinary frequency, Urinating at Night and Weak urinary stream. Not Present- Blood in Urine, Discharge, Flank Pain, Incontinence, Painful Urination, Urgency and Urinary Retention. Musculoskeletal Present- Joint Pain. Not Present- Back Pain, Joint Swelling, Morning Stiffness, Muscle Pain, Muscle Weakness and Spasms. Neurological Not Present- Blackout spells, Difficulty with balance, Dizziness, Paralysis, Tremor and Weakness. Psychiatric Not Present- Insomnia.  Vitals  Weight: 218 lb Height: 71in Body Surface Area: 2.19 m Body Mass Index: 30.4 kg/m  Pulse: 76 (Regular)  BP: 162/92 (Sitting, Left Arm, Standard)  Physical Exam General Mental Status -Alert,  cooperative and good historian. General Appearance-pleasant, Not in acute distress. Orientation-Oriented X3. Build & Nutrition-Well nourished and Well developed.  Head and Neck Head-normocephalic, atraumatic . Neck Global Assessment - supple, no bruit auscultated on the right, no bruit auscultated on the left.  Eye Vision-Wears corrective lenses. Pupil - Bilateral-Regular and Round. Motion - Bilateral-EOMI.  ENMT Note: Upper full and partial lower denture plate   Chest and Lung Exam Auscultation Breath sounds - clear at anterior chest wall and clear at posterior chest wall. Adventitious sounds - No Adventitious sounds.  Cardiovascular Auscultation Rhythm - Regular rate and rhythm. Heart Sounds - S1 WNL and S2 WNL. Murmurs & Other Heart Sounds - Auscultation of the heart reveals - No Murmurs.  Abdomen Palpation/Percussion Tenderness - Abdomen is non-tender to palpation. Rigidity (guarding) - Abdomen is soft. Auscultation Auscultation of the abdomen reveals - Bowel sounds normal.  Male Genitourinary Note: Not done, not pertinent to present illness   Musculoskeletal Note: On exam, he is alert and oriented, in no apparent distress. His right knee shows no effusion now. Range about 0 to 115. There is some tenderness laterally with no medial tenderness or any instability. We reviewed the bone scan and he does have increased uptake, especially around the femoral component and somewhat around the patellar component. The tibial component shows a tiny bit of increased uptake proximally, but nothing around the stem.   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 Todd Granger - Patient has been seen preoperatively  and felt to be stable for surgery.  IV TXA  Anesthesia Issues: None  Signed electronically by Alezandrew L Rolondo Pierre, III PA-C  

## 2016-03-02 ENCOUNTER — Inpatient Hospital Stay (HOSPITAL_COMMUNITY)
Admission: RE | Admit: 2016-03-02 | Discharge: 2016-03-04 | DRG: 468 | Disposition: A | Discharge status: Home Health | Payer: Medicare Other | Source: Ambulatory Visit | Attending: Orthopedic Surgery | Admitting: Orthopedic Surgery

## 2016-03-02 ENCOUNTER — Inpatient Hospital Stay (HOSPITAL_COMMUNITY): Payer: Medicare Other | Admitting: Certified Registered"

## 2016-03-02 ENCOUNTER — Encounter (HOSPITAL_COMMUNITY): Payer: Self-pay | Admitting: *Deleted

## 2016-03-02 ENCOUNTER — Encounter (HOSPITAL_COMMUNITY)
Admission: RE | Disposition: A | Discharge status: Home Health | Payer: Self-pay | Source: Ambulatory Visit | Attending: Orthopedic Surgery

## 2016-03-02 DIAGNOSIS — K449 Diaphragmatic hernia without obstruction or gangrene: Secondary | ICD-10-CM | POA: Diagnosis present

## 2016-03-02 DIAGNOSIS — Z87891 Personal history of nicotine dependence: Secondary | ICD-10-CM | POA: Diagnosis not present

## 2016-03-02 DIAGNOSIS — I10 Essential (primary) hypertension: Secondary | ICD-10-CM | POA: Diagnosis present

## 2016-03-02 DIAGNOSIS — M109 Gout, unspecified: Secondary | ICD-10-CM | POA: Diagnosis present

## 2016-03-02 DIAGNOSIS — T84032A Mechanical loosening of internal right knee prosthetic joint, initial encounter: Secondary | ICD-10-CM | POA: Diagnosis present

## 2016-03-02 DIAGNOSIS — Z01812 Encounter for preprocedural laboratory examination: Secondary | ICD-10-CM | POA: Diagnosis not present

## 2016-03-02 DIAGNOSIS — T84012A Broken internal right knee prosthesis, initial encounter: Secondary | ICD-10-CM

## 2016-03-02 DIAGNOSIS — Z96659 Presence of unspecified artificial knee joint: Secondary | ICD-10-CM

## 2016-03-02 DIAGNOSIS — H9190 Unspecified hearing loss, unspecified ear: Secondary | ICD-10-CM | POA: Diagnosis present

## 2016-03-02 DIAGNOSIS — H409 Unspecified glaucoma: Secondary | ICD-10-CM | POA: Diagnosis present

## 2016-03-02 DIAGNOSIS — K219 Gastro-esophageal reflux disease without esophagitis: Secondary | ICD-10-CM | POA: Diagnosis present

## 2016-03-02 DIAGNOSIS — T84018A Broken internal joint prosthesis, other site, initial encounter: Secondary | ICD-10-CM

## 2016-03-02 DIAGNOSIS — Y792 Prosthetic and other implants, materials and accessory orthopedic devices associated with adverse incidents: Secondary | ICD-10-CM | POA: Diagnosis present

## 2016-03-02 HISTORY — PX: TOTAL KNEE REVISION: SHX996

## 2016-03-02 LAB — TYPE AND SCREEN
ABO/RH(D): O POS
ANTIBODY SCREEN: NEGATIVE

## 2016-03-02 SURGERY — TOTAL KNEE REVISION
Anesthesia: Spinal | Site: Knee | Laterality: Right

## 2016-03-02 MED ORDER — CEFAZOLIN SODIUM-DEXTROSE 2-4 GM/100ML-% IV SOLN
2.0000 g | INTRAVENOUS | Status: AC
  Administered 2016-03-02: 2 g via INTRAVENOUS
  Filled 2016-03-02: qty 100

## 2016-03-02 MED ORDER — TRANEXAMIC ACID 1000 MG/10ML IV SOLN
1000.0000 mg | INTRAVENOUS | Status: AC
  Administered 2016-03-02: 1000 mg via INTRAVENOUS
  Filled 2016-03-02: qty 10

## 2016-03-02 MED ORDER — DIPHENHYDRAMINE HCL 12.5 MG/5ML PO ELIX: 12.5000 mg | ORAL_SOLUTION | ORAL | Status: DC | PRN

## 2016-03-02 MED ORDER — METOCLOPRAMIDE HCL 5 MG/ML IJ SOLN: 5.0000 mg | Freq: Three times a day (TID) | INTRAMUSCULAR | Status: DC | PRN

## 2016-03-02 MED ORDER — LIDOCAINE HCL (CARDIAC) 20 MG/ML IV SOLN
INTRAVENOUS | Status: AC
  Filled 2016-03-02: qty 5

## 2016-03-02 MED ORDER — COLCHICINE 0.6 MG PO TABS
0.6000 mg | ORAL_TABLET | Freq: Every day | ORAL | Status: DC | PRN
  Filled 2016-03-02: qty 1

## 2016-03-02 MED ORDER — ACETAMINOPHEN 325 MG PO TABS
650.0000 mg | ORAL_TABLET | Freq: Four times a day (QID) | ORAL | Status: DC | PRN
  Administered 2016-03-03: 650 mg via ORAL
  Filled 2016-03-02: qty 2

## 2016-03-02 MED ORDER — RIVAROXABAN 10 MG PO TABS
10.0000 mg | ORAL_TABLET | Freq: Every day | ORAL | Status: DC
  Administered 2016-03-03 – 2016-03-04 (×2): 10 mg via ORAL
  Filled 2016-03-02 (×3): qty 1

## 2016-03-02 MED ORDER — BUPIVACAINE LIPOSOME 1.3 % IJ SUSP
20.0000 mL | Freq: Once | INTRAMUSCULAR | Status: DC
  Filled 2016-03-02: qty 20

## 2016-03-02 MED ORDER — TRAMADOL HCL 50 MG PO TABS
50.0000 mg | ORAL_TABLET | Freq: Four times a day (QID) | ORAL | Status: DC | PRN
  Administered 2016-03-03: 100 mg via ORAL
  Administered 2016-03-04: 50 mg via ORAL
  Filled 2016-03-02: qty 1
  Filled 2016-03-02: qty 2

## 2016-03-02 MED ORDER — PREDNISONE 10 MG PO TABS
10.0000 mg | ORAL_TABLET | Freq: Two times a day (BID) | ORAL | Status: AC
  Administered 2016-03-03 (×2): 10 mg via ORAL
  Filled 2016-03-02 (×3): qty 1

## 2016-03-02 MED ORDER — ONDANSETRON HCL 4 MG/2ML IJ SOLN: 4.0000 mg | Freq: Four times a day (QID) | INTRAMUSCULAR | Status: DC | PRN

## 2016-03-02 MED ORDER — POLYETHYLENE GLYCOL 3350 17 G PO PACK
17.0000 g | PACK | Freq: Every day | ORAL | Status: DC | PRN
Start: 2016-03-02 — End: 2016-03-04

## 2016-03-02 MED ORDER — BUPIVACAINE HCL 0.25 % IJ SOLN
INTRAMUSCULAR | Status: DC | PRN
  Administered 2016-03-02: 20 mL

## 2016-03-02 MED ORDER — PREDNISONE 5 MG PO TABS
5.0000 mg | ORAL_TABLET | Freq: Every day | ORAL | Status: DC
  Filled 2016-03-02: qty 1

## 2016-03-02 MED ORDER — MORPHINE SULFATE (PF) 2 MG/ML IV SOLN
1.0000 mg | INTRAVENOUS | Status: DC | PRN
  Administered 2016-03-02 (×2): 1 mg via INTRAVENOUS
  Filled 2016-03-02 (×2): qty 1

## 2016-03-02 MED ORDER — HYDROMORPHONE HCL 1 MG/ML IJ SOLN
INTRAMUSCULAR | Status: AC
  Filled 2016-03-02: qty 1

## 2016-03-02 MED ORDER — SIMVASTATIN 20 MG PO TABS
20.0000 mg | ORAL_TABLET | Freq: Every evening | ORAL | Status: DC
  Administered 2016-03-02 – 2016-03-03 (×2): 20 mg via ORAL
  Filled 2016-03-02 (×3): qty 1

## 2016-03-02 MED ORDER — ONDANSETRON HCL 4 MG/2ML IJ SOLN
4.0000 mg | Freq: Once | INTRAMUSCULAR | Status: DC | PRN
Start: 2016-03-02 — End: 2016-03-02

## 2016-03-02 MED ORDER — ONDANSETRON HCL 4 MG PO TABS: 4.0000 mg | ORAL_TABLET | Freq: Four times a day (QID) | ORAL | Status: DC | PRN

## 2016-03-02 MED ORDER — OXYCODONE HCL 5 MG/5ML PO SOLN
5.0000 mg | Freq: Once | ORAL | Status: AC | PRN
  Filled 2016-03-02: qty 5

## 2016-03-02 MED ORDER — DEXAMETHASONE SODIUM PHOSPHATE 10 MG/ML IJ SOLN
INTRAMUSCULAR | Status: AC
  Filled 2016-03-02: qty 1

## 2016-03-02 MED ORDER — OXYCODONE HCL 5 MG PO TABS
5.0000 mg | ORAL_TABLET | Freq: Once | ORAL | Status: AC | PRN
  Administered 2016-03-02: 5 mg via ORAL

## 2016-03-02 MED ORDER — SODIUM CHLORIDE 0.9 % IR SOLN
Status: DC | PRN
  Administered 2016-03-02: 3000 mL

## 2016-03-02 MED ORDER — BUPIVACAINE HCL (PF) 0.25 % IJ SOLN
INTRAMUSCULAR | Status: AC
  Filled 2016-03-02: qty 30

## 2016-03-02 MED ORDER — OXYCODONE HCL 5 MG PO TABS
5.0000 mg | ORAL_TABLET | ORAL | Status: DC | PRN
  Administered 2016-03-02 – 2016-03-03 (×4): 10 mg via ORAL
  Filled 2016-03-02 (×4): qty 2

## 2016-03-02 MED ORDER — CEFAZOLIN SODIUM-DEXTROSE 2-4 GM/100ML-% IV SOLN
2.0000 g | Freq: Four times a day (QID) | INTRAVENOUS | Status: AC
  Administered 2016-03-02 – 2016-03-03 (×2): 2 g via INTRAVENOUS
  Filled 2016-03-02 (×2): qty 100

## 2016-03-02 MED ORDER — FLEET ENEMA 7-19 GM/118ML RE ENEM: 1.0000 | ENEMA | Freq: Once | RECTAL | Status: DC | PRN

## 2016-03-02 MED ORDER — FENTANYL CITRATE (PF) 100 MCG/2ML IJ SOLN
INTRAMUSCULAR | Status: AC
  Filled 2016-03-02: qty 2

## 2016-03-02 MED ORDER — METOCLOPRAMIDE HCL 10 MG PO TABS
5.0000 mg | ORAL_TABLET | Freq: Three times a day (TID) | ORAL | Status: DC | PRN
Start: 2016-03-02 — End: 2016-03-04

## 2016-03-02 MED ORDER — ROPIVACAINE HCL 5 MG/ML IJ SOLN
INTRAMUSCULAR | Status: AC
  Filled 2016-03-02: qty 30

## 2016-03-02 MED ORDER — ACETAMINOPHEN 10 MG/ML IV SOLN
1000.0000 mg | Freq: Once | INTRAVENOUS | Status: AC
  Administered 2016-03-02: 1000 mg via INTRAVENOUS
  Filled 2016-03-02: qty 100

## 2016-03-02 MED ORDER — SODIUM CHLORIDE 0.9 % IJ SOLN
INTRAMUSCULAR | Status: AC
  Filled 2016-03-02: qty 50

## 2016-03-02 MED ORDER — TRANEXAMIC ACID 1000 MG/10ML IV SOLN
1000.0000 mg | Freq: Once | INTRAVENOUS | Status: AC
  Administered 2016-03-02: 1000 mg via INTRAVENOUS
  Filled 2016-03-02: qty 10

## 2016-03-02 MED ORDER — LIDOCAINE HCL (PF) 2 % IJ SOLN
INTRAMUSCULAR | Status: DC | PRN
  Administered 2016-03-02: 80 mg via INTRADERMAL

## 2016-03-02 MED ORDER — ONDANSETRON HCL 4 MG/2ML IJ SOLN
INTRAMUSCULAR | Status: AC
  Filled 2016-03-02: qty 2

## 2016-03-02 MED ORDER — ACETAMINOPHEN 650 MG RE SUPP
650.0000 mg | Freq: Four times a day (QID) | RECTAL | Status: DC | PRN
Start: 2016-03-03 — End: 2016-03-04

## 2016-03-02 MED ORDER — BISACODYL 10 MG RE SUPP: 10.0000 mg | Freq: Every day | RECTAL | Status: DC | PRN

## 2016-03-02 MED ORDER — DOXAZOSIN MESYLATE 2 MG PO TABS
2.0000 mg | ORAL_TABLET | Freq: Every day | ORAL | Status: DC
  Administered 2016-03-03 – 2016-03-04 (×2): 2 mg via ORAL
  Filled 2016-03-02 (×2): qty 1

## 2016-03-02 MED ORDER — OXYCODONE HCL 5 MG PO TABS
ORAL_TABLET | ORAL | Status: AC
  Administered 2016-03-02: 17:00:00
  Filled 2016-03-02: qty 1

## 2016-03-02 MED ORDER — MENTHOL 3 MG MT LOZG: 1.0000 | LOZENGE | OROMUCOSAL | Status: DC | PRN

## 2016-03-02 MED ORDER — CEFAZOLIN SODIUM-DEXTROSE 2-4 GM/100ML-% IV SOLN
INTRAVENOUS | Status: AC
  Filled 2016-03-02: qty 100

## 2016-03-02 MED ORDER — DIPHENHYDRAMINE HCL 25 MG PO CAPS: 25.0000 mg | ORAL_CAPSULE | Freq: Four times a day (QID) | ORAL | Status: DC | PRN

## 2016-03-02 MED ORDER — PROPOFOL 10 MG/ML IV BOLUS
INTRAVENOUS | Status: DC | PRN
  Administered 2016-03-02: 150 mg via INTRAVENOUS

## 2016-03-02 MED ORDER — PANTOPRAZOLE SODIUM 40 MG PO TBEC
40.0000 mg | DELAYED_RELEASE_TABLET | Freq: Two times a day (BID) | ORAL | Status: DC
  Administered 2016-03-02 – 2016-03-03 (×2): 40 mg via ORAL
  Filled 2016-03-02 (×4): qty 1

## 2016-03-02 MED ORDER — MECLIZINE HCL 25 MG PO TABS
25.0000 mg | ORAL_TABLET | Freq: Three times a day (TID) | ORAL | Status: DC | PRN
  Filled 2016-03-02: qty 1

## 2016-03-02 MED ORDER — ACETAMINOPHEN 500 MG PO TABS
1000.0000 mg | ORAL_TABLET | Freq: Four times a day (QID) | ORAL | Status: AC
  Administered 2016-03-02 – 2016-03-03 (×4): 1000 mg via ORAL
  Filled 2016-03-02 (×5): qty 2

## 2016-03-02 MED ORDER — LATANOPROST 0.005 % OP SOLN
1.0000 [drp] | Freq: Every day | OPHTHALMIC | Status: DC
  Administered 2016-03-02 – 2016-03-03 (×2): 1 [drp] via OPHTHALMIC
  Filled 2016-03-02: qty 2.5

## 2016-03-02 MED ORDER — SODIUM CHLORIDE 0.9 % IJ SOLN
INTRAMUSCULAR | Status: DC | PRN
Start: 2016-03-02 — End: 2016-03-02
  Administered 2016-03-02: 30 mL

## 2016-03-02 MED ORDER — DEXAMETHASONE SODIUM PHOSPHATE 10 MG/ML IJ SOLN: 10.0000 mg | Freq: Once | INTRAMUSCULAR | Status: DC

## 2016-03-02 MED ORDER — FENTANYL CITRATE (PF) 100 MCG/2ML IJ SOLN
INTRAMUSCULAR | Status: DC | PRN
  Administered 2016-03-02 (×2): 25 ug via INTRAVENOUS
  Administered 2016-03-02: 50 ug via INTRAVENOUS

## 2016-03-02 MED ORDER — DOCUSATE SODIUM 100 MG PO CAPS
100.0000 mg | ORAL_CAPSULE | Freq: Two times a day (BID) | ORAL | Status: DC
  Administered 2016-03-02 – 2016-03-04 (×4): 100 mg via ORAL

## 2016-03-02 MED ORDER — PREDNISONE 5 MG PO TABS
5.0000 mg | ORAL_TABLET | Freq: Two times a day (BID) | ORAL | Status: DC
  Administered 2016-03-04: 5 mg via ORAL
  Filled 2016-03-02 (×2): qty 1

## 2016-03-02 MED ORDER — HYDROMORPHONE HCL 1 MG/ML IJ SOLN
0.2500 mg | INTRAMUSCULAR | Status: DC | PRN
  Administered 2016-03-02 (×6): 0.5 mg via INTRAVENOUS

## 2016-03-02 MED ORDER — METHOCARBAMOL 500 MG PO TABS
500.0000 mg | ORAL_TABLET | Freq: Four times a day (QID) | ORAL | Status: DC | PRN
  Administered 2016-03-02 – 2016-03-04 (×4): 500 mg via ORAL
  Filled 2016-03-02 (×4): qty 1

## 2016-03-02 MED ORDER — SODIUM CHLORIDE 0.9 % IV SOLN: INTRAVENOUS | Status: DC

## 2016-03-02 MED ORDER — BUPIVACAINE LIPOSOME 1.3 % IJ SUSP
INTRAMUSCULAR | Status: DC | PRN
  Administered 2016-03-02: 20 mL

## 2016-03-02 MED ORDER — PROPOFOL 10 MG/ML IV BOLUS
INTRAVENOUS | Status: AC
  Filled 2016-03-02: qty 20

## 2016-03-02 MED ORDER — SUCCINYLCHOLINE CHLORIDE 20 MG/ML IJ SOLN
INTRAMUSCULAR | Status: DC | PRN
  Administered 2016-03-02: 100 mg via INTRAVENOUS

## 2016-03-02 MED ORDER — PHENOL 1.4 % MT LIQD: 1.0000 | OROMUCOSAL | Status: DC | PRN

## 2016-03-02 MED ORDER — ONDANSETRON HCL 4 MG/2ML IJ SOLN
INTRAMUSCULAR | Status: DC | PRN
  Administered 2016-03-02: 4 mg via INTRAVENOUS

## 2016-03-02 MED ORDER — METHOCARBAMOL 1000 MG/10ML IJ SOLN
500.0000 mg | Freq: Four times a day (QID) | INTRAVENOUS | Status: DC | PRN
  Administered 2016-03-02: 500 mg via INTRAVENOUS
  Filled 2016-03-02 (×2): qty 5

## 2016-03-02 MED ORDER — ROPIVACAINE HCL 5 MG/ML IJ SOLN
INTRAMUSCULAR | Status: DC | PRN
  Administered 2016-03-02: 30 mL via PERINEURAL

## 2016-03-02 MED ORDER — ACETAMINOPHEN 10 MG/ML IV SOLN
INTRAVENOUS | Status: AC
  Filled 2016-03-02: qty 100

## 2016-03-02 MED ORDER — PREDNISONE 20 MG PO TABS
20.0000 mg | ORAL_TABLET | Freq: Once | ORAL | Status: DC
  Filled 2016-03-02: qty 1

## 2016-03-02 MED ORDER — LACTATED RINGERS IV SOLN
INTRAVENOUS | Status: DC
  Administered 2016-03-02: 1000 mL via INTRAVENOUS
  Administered 2016-03-02: 16:00:00 via INTRAVENOUS

## 2016-03-02 MED ORDER — DEXAMETHASONE SODIUM PHOSPHATE 10 MG/ML IJ SOLN
10.0000 mg | Freq: Once | INTRAMUSCULAR | Status: AC
Start: 2016-03-02 — End: 2016-03-02
  Administered 2016-03-02: 10 mg via INTRAVENOUS

## 2016-03-02 SURGICAL SUPPLY — 64 items
ADAPTER BOLT FEMORAL +2/-2 (Knees) ×3 IMPLANT
AUGMENT DIST PFC SIGMA 4MM RT (Knees) ×1 IMPLANT
AUGMENT FMRL POST PFC 4MM SZ5 (Orthopedic Implant) ×1 IMPLANT
AUGMENT POST 5 8 (Orthopedic Implant) ×2 IMPLANT
AUGMENT POST 5 8MM (Orthopedic Implant) ×1 IMPLANT
BAG DECANTER FOR FLEXI CONT (MISCELLANEOUS) ×3 IMPLANT
BAG ZIPLOCK 12X15 (MISCELLANEOUS) IMPLANT
BANDAGE ACE 6X5 VEL STRL LF (GAUZE/BANDAGES/DRESSINGS) IMPLANT
BANDAGE ELASTIC 6 VELCRO ST LF (GAUZE/BANDAGES/DRESSINGS) ×3 IMPLANT
BLADE SAG 18X100X1.27 (BLADE) ×3 IMPLANT
BLADE SAW SGTL 11.0X1.19X90.0M (BLADE) ×3 IMPLANT
BONE CEMENT GENTAMICIN (Cement) ×9 IMPLANT
CEMENT BONE GENTAMICIN 40 (Cement) ×3 IMPLANT
CLOSURE WOUND 1/2 X4 (GAUZE/BANDAGES/DRESSINGS) ×1
CLOTH BEACON ORANGE TIMEOUT ST (SAFETY) ×3 IMPLANT
CUFF TOURN SGL QUICK 34 (TOURNIQUET CUFF) ×2
CUFF TRNQT CYL 34X4X40X1 (TOURNIQUET CUFF) ×1 IMPLANT
DIS AUG PFC SIGMA 4MM RIGHT (Knees) ×3 IMPLANT
DRAPE U-SHAPE 47X51 STRL (DRAPES) ×3 IMPLANT
DRSG ADAPTIC 3X8 NADH LF (GAUZE/BANDAGES/DRESSINGS) ×3 IMPLANT
DRSG PAD ABDOMINAL 8X10 ST (GAUZE/BANDAGES/DRESSINGS) IMPLANT
DURAPREP 26ML APPLICATOR (WOUND CARE) ×3 IMPLANT
ELECT REM PT RETURN 9FT ADLT (ELECTROSURGICAL) ×3
ELECTRODE REM PT RTRN 9FT ADLT (ELECTROSURGICAL) ×1 IMPLANT
EVACUATOR 1/8 PVC DRAIN (DRAIN) ×3 IMPLANT
FEM POST AUG PFC 4MM SZ5 (Orthopedic Implant) ×3 IMPLANT
FEM TC3 PFC SIGMA SZ5 (Orthopedic Implant) ×3 IMPLANT
FEMORAL ADAPTER (Orthopedic Implant) ×3 IMPLANT
FEMORAL TC3 PFC SIGMA SZ5 (Orthopedic Implant) ×1 IMPLANT
GAUZE SPONGE 4X4 12PLY STRL (GAUZE/BANDAGES/DRESSINGS) ×3 IMPLANT
GLOVE BIO SURGEON STRL SZ7.5 (GLOVE) IMPLANT
GLOVE BIO SURGEON STRL SZ8 (GLOVE) ×3 IMPLANT
GLOVE BIOGEL PI IND STRL 8 (GLOVE) ×1 IMPLANT
GLOVE BIOGEL PI INDICATOR 8 (GLOVE) ×2
GLOVE SURG SS PI 6.5 STRL IVOR (GLOVE) IMPLANT
GOWN STRL REUS W/TWL LRG LVL3 (GOWN DISPOSABLE) ×3 IMPLANT
GOWN STRL REUS W/TWL XL LVL3 (GOWN DISPOSABLE) IMPLANT
HANDPIECE INTERPULSE COAX TIP (DISPOSABLE) ×2
IMMOBILIZER KNEE 20 (SOFTGOODS) ×3 IMPLANT
IMMOBILIZER KNEE 20 THIGH 36 (SOFTGOODS) IMPLANT
INSERT SIGMA RP TC3 5 17.5 (Knees) ×3 IMPLANT
MANIFOLD NEPTUNE II (INSTRUMENTS) ×3 IMPLANT
NS IRRIG 1000ML POUR BTL (IV SOLUTION) ×3 IMPLANT
PACK TOTAL KNEE CUSTOM (KITS) ×3 IMPLANT
PAD ABD 8X10 STRL (GAUZE/BANDAGES/DRESSINGS) ×3 IMPLANT
PADDING CAST COTTON 6X4 STRL (CAST SUPPLIES) ×3 IMPLANT
PATELLA DOME PFC 41MM (Knees) ×3 IMPLANT
POSITIONER SURGICAL ARM (MISCELLANEOUS) ×3 IMPLANT
SET HNDPC FAN SPRY TIP SCT (DISPOSABLE) ×1 IMPLANT
STEM FLUTED UNIV REV 75X20 (Stem) ×3 IMPLANT
STRIP CLOSURE SKIN 1/2X4 (GAUZE/BANDAGES/DRESSINGS) ×2 IMPLANT
SUT MNCRL AB 4-0 PS2 18 (SUTURE) ×3 IMPLANT
SUT VIC AB 2-0 CT1 27 (SUTURE) ×6
SUT VIC AB 2-0 CT1 TAPERPNT 27 (SUTURE) ×3 IMPLANT
SUT VLOC 180 0 24IN GS25 (SUTURE) ×3 IMPLANT
SWAB COLLECTION DEVICE MRSA (MISCELLANEOUS) IMPLANT
SWAB CULTURE ESWAB REG 1ML (MISCELLANEOUS) IMPLANT
SYR 50ML LL SCALE MARK (SYRINGE) ×6 IMPLANT
TOWER CARTRIDGE SMART MIX (DISPOSABLE) ×3 IMPLANT
TRAY FOLEY W/METER SILVER 14FR (SET/KITS/TRAYS/PACK) IMPLANT
TRAY FOLEY W/METER SILVER 16FR (SET/KITS/TRAYS/PACK) ×3 IMPLANT
TUBE KAMVAC SUCTION (TUBING) IMPLANT
WATER STERILE IRR 1500ML POUR (IV SOLUTION) ×3 IMPLANT
WRAP KNEE MAXI GEL POST OP (GAUZE/BANDAGES/DRESSINGS) ×3 IMPLANT

## 2016-03-02 NOTE — Brief Op Note (Signed)
03/02/2016  3:57 PM  PATIENT:  Kenneth Parker  75 y.o. male  PRE-OPERATIVE DIAGNOSIS:  loose right total knee arthroplasty  POST-OPERATIVE DIAGNOSIS:  loose right total knee arthroplasty  PROCEDURE:  Right knee femoral and patellar revision  SURGEON:  Surgeon(s) and Role:    * Ollen GrossFrank Lavanna Rog, MD - Primary  PHYSICIAN ASSISTANT:   ASSISTANTS: Avel Peacerew Perkins, PA-C   ANESTHESIA:   Adductor canal block and General  EBL:  Total I/O In: 1000 [I.V.:1000] Out: 200 [Urine:100; Blood:100]  BLOOD ADMINISTERED:none  DRAINS: (Medium) Hemovact drain(s) in the right knee with  Suction Open   LOCAL MEDICATIONS USED:  OTHER Exparel  COUNTS:  YES  TOURNIQUET:   Total Tourniquet Time Documented: Thigh (Right) - 55 minutes Total: Thigh (Right) - 55 minutes   DICTATION: .Other Dictation: Dictation Number [id number]  PLAN OF CARE: Admit to inpatient   PATIENT DISPOSITION:  PACU - hemodynamically stable.

## 2016-03-02 NOTE — Anesthesia Procedure Notes (Addendum)
Anesthesia Regional Block:  Adductor canal block  Pre-Anesthetic Checklist: ,, timeout performed, Correct Patient, Correct Site, Correct Laterality, Correct Procedure, Correct Position, site marked, Risks and benefits discussed,  Surgical consent,  Pre-op evaluation,  At surgeon's request and post-op pain management  Laterality: Right  Prep: chloraprep       Needles:  Injection technique: Single-shot  Needle Type: Echogenic Stimulator Needle     Needle Length: 9cm 9 cm Needle Gauge: 21 and 21 G    Additional Needles:  Procedures: ultrasound guided (picture in chart) Adductor canal block Narrative:  Injection made incrementally with aspirations every 5 mL.  Performed by: Personally  Anesthesiologist: JUDD, BENJAMIN  Additional Notes: Risks, benefits and alternative to block explained extensively.  Patient tolerated procedure well, without complications.   Procedure Name: Intubation Date/Time: 03/02/2016 2:39 PM Performed by: Delphia GratesHANDLER, Treylan Mcclintock Pre-anesthesia Checklist: Emergency Drugs available, Suction available, Patient being monitored and Patient identified Patient Re-evaluated:Patient Re-evaluated prior to inductionOxygen Delivery Method: Circle system utilized Preoxygenation: Pre-oxygenation with 100% oxygen Intubation Type: IV induction Laryngoscope Size: Mac and 4 Grade View: Grade I Tube type: Oral Tube size: 7.5 mm Number of attempts: 1 Airway Equipment and Method: Stylet Placement Confirmation: ETT inserted through vocal cords under direct vision,  breath sounds checked- equal and bilateral and positive ETCO2 Secured at: 22 cm Tube secured with: Tape Dental Injury: Teeth and Oropharynx as per pre-operative assessment

## 2016-03-02 NOTE — Progress Notes (Signed)
6th floor aware pt will b e in 1617 in about 20 minutes,  Pt needs pulsox and pump.

## 2016-03-02 NOTE — Anesthesia Preprocedure Evaluation (Addendum)
Anesthesia Evaluation  Patient identified by MRN, date of birth, ID band Patient awake    Reviewed: Allergy & Precautions, H&P , NPO status , Patient's Chart, lab work & pertinent test results  Airway Mallampati: II  TM Distance: >3 FB Neck ROM: Full    Dental no notable dental hx. (+) Upper Dentures   Pulmonary neg pulmonary ROS, neg pneumonia , former smoker,    Pulmonary exam normal breath sounds clear to auscultation       Cardiovascular hypertension, Pt. on medications Normal cardiovascular exam Rhythm:Regular Rate:Normal     Neuro/Psych negative neurological ROS  negative psych ROS   GI/Hepatic Neg liver ROS, hiatal hernia, GERD  Medicated and Controlled,  Endo/Other  negative endocrine ROS  Renal/GU negative Renal ROS  negative genitourinary   Musculoskeletal negative musculoskeletal ROS (+)   Abdominal   Peds negative pediatric ROS (+)  Hematology negative hematology ROS (+)   Anesthesia Other Findings   Reproductive/Obstetrics negative OB ROS                            Anesthesia Physical  Anesthesia Plan  ASA: II  Anesthesia Plan: General and Regional   Post-op Pain Management: GA combined w/ Regional for post-op pain   Induction: Intravenous  Airway Management Planned: LMA  Additional Equipment:   Intra-op Plan:   Post-operative Plan:   Informed Consent: I have reviewed the patients History and Physical, chart, labs and discussed the procedure including the risks, benefits and alternatives for the proposed anesthesia with the patient or authorized representative who has indicated his/her understanding and acceptance.   Dental advisory given  Plan Discussed with: CRNA  Anesthesia Plan Comments: (Spinal offered with risks and benefits discussed and patient refuses spinal anesthetic, will perform GA with LMA and adductor canal block for pain control)        Anesthesia Quick Evaluation

## 2016-03-02 NOTE — Anesthesia Postprocedure Evaluation (Signed)
Anesthesia Post Note  Patient: Kenneth Parker  Procedure(s) Performed: Procedure(s) (LRB): RIGHT KNEE FEMORAL REVISION (Right)  Patient location during evaluation: PACU Anesthesia Type: General and Regional Level of consciousness: awake and alert Pain management: pain level controlled Vital Signs Assessment: post-procedure vital signs reviewed and stable Respiratory status: spontaneous breathing, nonlabored ventilation, respiratory function stable and patient connected to nasal cannula oxygen Cardiovascular status: blood pressure returned to baseline and stable Postop Assessment: no signs of nausea or vomiting Anesthetic complications: no    Last Vitals:  Filed Vitals:   03/02/16 1410 03/02/16 1630  BP:  156/93  Pulse: 65 81  Temp:  36.8 C  Resp: 20 18    Last Pain: There were no vitals filed for this visit.               Reino KentJudd, Nyzier Boivin J

## 2016-03-02 NOTE — H&P (View-Only) (Signed)
Kenneth Parker DOB: 1941-07-23 Married / Language: English / Race: White Male Date of Admission:  03/02/16 CC:  Loose Right Total Knee History of Present Illness The patient is a 75 year old male who comes in for a preoperative History and Physical. The patient is scheduled for a right femoral revision versus total knee revision to be performed by Dr. Gus RankinFrank V. Aluisio, MD at [facility] on 03/02/2016. The patient is a 75 year old male who presented for follow up of their knee. The patient is being followed for their right knee pain. They are now 3 year(s) out from total knee revision. Symptoms reported include: pain, aching and difficulty ambulating. The patient feels that they are doing poorly. The following medication has been used for pain control: none. The patient presented following bone scan. He continues with the lateral sided discomfort and weightbearing pain. It is worse when he first gets up and after he is walking for a while it start bothering him again. When I aspirated the last time he said his pain has increased since. We reviewed the bone scan and he does have increased uptake, especially around the femoral component and somewhat around the patellar component. The tibial component shows a tiny bit of increased uptake proximally, but nothing around the stem. 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 At this point, the most predictable means of improving pain and function is revision total knee arthroplasty. The procedure, risks, potential complications and rehab course are discussed in detail and the patient elects to proceed. The tibial component looks stable on the bone scan. He said this is somewhat debilitating and is now admitted for surgical intervention. They have been treated conservatively in the past for the above stated problem and despite conservative measures, they continue to have progressive  pain and severe functional limitations and dysfunction. They have failed non-operative management including home exercise, medications. It is felt that they would benefit from undergoing revision of the total joint replacement. Risks and benefits of the procedure have been discussed with the patient and they elect to proceed with surgery. There are no active contraindications to surgery such as ongoing infection or rapidly progressive neurological disease.  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 age 77 Mother  Deceased, Congestive Heart Failure. 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 Omeprazole (20MG  Tablet DR, Oral) Active. Simvastatin (20MG  Tablet, Oral) Active. Allopurinol (100MG  Tablet, Oral) Active. Vitamin C ER (   Capsule ER, Oral) Active. Fish Oil Burp-Less (  Capsule, Oral) Active. Aspirin (   Tablet, Oral) Active. Amoxicillin (  Capsule, Oral) Active. (dental protocol) Lidocaine (5% Patch, External) Active. Latanoprost (0.005% Solution, Ophthalmic) Active. Meclizine HCl (  Tablet, Oral as needed) Active. DiphenhydrAMINE HCl (  Capsule, Oral as needed) Active. Butalbital-APAP-Caffeine (50-325-40MG  Tablet, Oral as needed) Active.  Past Surgical History Total Knee Replacement  Date: 05/11/2009. right Hemorrhoidectomy  Date: 2011.   Review of Systems General Not Present- Chills, Fatigue, Fever, Memory Loss, Night Sweats, Weight Gain and Weight Loss. Skin Present- Hives (none recently). Not Present- Eczema, Itching, Lesions and Rash. HEENT Present- Hearing Loss. Not Present- Dentures, Double Vision, Headache, Tinnitus and Visual Loss. Respiratory Not Present- Allergies, Chronic Cough, Coughing up blood, Shortness of breath at rest and Shortness of breath with exertion. Cardiovascular Not Present- Chest Pain, Difficulty Breathing Lying Down, Murmur, Palpitations, Racing/skipping heartbeats and Swelling. Gastrointestinal Not Present- Abdominal Pain, Bloody Stool, Constipation, Diarrhea, Difficulty Swallowing, Heartburn, Jaundice, Loss of appetitie, Nausea and Vomiting. Male Genitourinary Present- Urinary frequency, Urinating at Night and Weak urinary stream. Not Present- Blood in Urine, Discharge, Flank Pain, Incontinence, Painful Urination, Urgency and Urinary Retention. Musculoskeletal Present- Joint Pain. Not Present- Back Pain, Joint Swelling, Morning Stiffness, Muscle Pain, Muscle Weakness and Spasms. Neurological Not Present- Blackout spells, Difficulty with balance, Dizziness, Paralysis, Tremor and Weakness. Psychiatric Not Present- Insomnia.  Vitals  Weight: 218 lb Height: 71in Body Surface Area: 2.19 m Body Mass Index: 30.4 kg/m  Pulse: 76 (Regular)  BP: 162/92 (Sitting, Left Arm, Standard)  Physical Exam General Mental Status -Alert,  cooperative and good historian. General Appearance-pleasant, Not in acute distress. Orientation-Oriented X3. Build & Nutrition-Well nourished and Well developed.  Head and Neck Head-normocephalic, atraumatic . Neck Global Assessment - supple, no bruit auscultated on the right, no bruit auscultated on the left.  Eye Vision-Wears corrective lenses. Pupil - Bilateral-Regular and Round. Motion - Bilateral-EOMI.  ENMT Note: Upper full and partial lower denture plate   Chest and Lung Exam Auscultation Breath sounds - clear at anterior chest wall and clear at posterior chest wall. Adventitious sounds - No Adventitious sounds.  Cardiovascular Auscultation Rhythm - Regular rate and rhythm. Heart Sounds - S1 WNL and S2 WNL. Murmurs & Other Heart Sounds - Auscultation of the heart reveals - No Murmurs.  Abdomen Palpation/Percussion Tenderness - Abdomen is non-tender to palpation. Rigidity (guarding) - Abdomen is soft. Auscultation Auscultation of the abdomen reveals - Bowel sounds normal.  Male Genitourinary Note: Not done, not pertinent to present illness   Musculoskeletal Note: On exam, he is alert and oriented, in no apparent distress. His right knee shows no effusion now. Range about 0 to 115. There is some tenderness laterally with no medial tenderness or any instability. We reviewed the bone scan and he does have increased uptake, especially around the femoral component and somewhat around the patellar component. The tibial component shows a tiny bit of increased uptake proximally, but nothing around the stem.   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 Venetia Maxon - Patient has been seen preoperatively  and felt to be stable for surgery.  IV TXA  Anesthesia Issues: None  Signed electronically by Beckey Rutter, III PA-C

## 2016-03-02 NOTE — Progress Notes (Signed)
AssistedDr. B. Judd with right, ultrasound guided, adductor canal block. Side rails up, monitors on throughout procedure. See vital signs in flow sheet. Tolerated Procedure well.  

## 2016-03-02 NOTE — Interval H&P Note (Signed)
History and Physical Interval Note:  03/02/2016 1:52 PM  Kenneth Parker  has presented today for surgery, with the diagnosis of loose right total knee arthroplasty  The various methods of treatment have been discussed with the patient and family. After consideration of risks, benefits and other options for treatment, the patient has consented to  Procedure(s): RIGHT KNEE FEMORAL VS TOTAL KNEE REVISION (Right) as a surgical intervention .  The patient's history has been reviewed, patient examined, no change in status, stable for surgery.  I have reviewed the patient's chart and labs.  Questions were answered to the patient's satisfaction.     Loanne DrillingALUISIO,Anah Billard V

## 2016-03-02 NOTE — Transfer of Care (Signed)
Immediate Anesthesia Transfer of Care Note  Patient: Kenneth Parker  Procedure(s) Performed: Procedure(s) with comments: RIGHT KNEE FEMORAL REVISION (Right) - With adductor block  Patient Location: PACU  Anesthesia Type:General and Regional  Level of Consciousness: awake, alert , oriented and patient cooperative  Airway & Oxygen Therapy: Patient Spontanous Breathing and Patient connected to face mask oxygen  Post-op Assessment: Report given to RN and Post -op Vital signs reviewed and stable  Post vital signs: Reviewed and stable  Last Vitals:  Filed Vitals:   03/02/16 1409 03/02/16 1410  BP:    Pulse: 69 65  Temp:    Resp: 18 20    Complications: No apparent anesthesia complications

## 2016-03-03 LAB — BASIC METABOLIC PANEL
Anion gap: 12 (ref 5–15)
BUN: 17 mg/dL (ref 6–20)
CHLORIDE: 104 mmol/L (ref 101–111)
CO2: 24 mmol/L (ref 22–32)
Calcium: 9.4 mg/dL (ref 8.9–10.3)
Creatinine, Ser: 1 mg/dL (ref 0.61–1.24)
GFR calc Af Amer: >60 mL/min (ref >60)
Glucose, Bld: 162 mg/dL — ABNORMAL HIGH (ref 65–99)
POTASSIUM: 4.3 mmol/L (ref 3.5–5.1)
SODIUM: 140 mmol/L (ref 135–145)

## 2016-03-03 LAB — CBC
HCT: 38.4 % — ABNORMAL LOW (ref 39.0–52.0)
HEMOGLOBIN: 12.9 g/dL — AB (ref 13.0–17.0)
MCH: 28.4 pg (ref 26.0–34.0)
MCHC: 33.6 g/dL (ref 30.0–36.0)
MCV: 84.4 fL (ref 78.0–100.0)
Platelets: 196 10*3/uL (ref 150–400)
RBC: 4.55 MIL/uL (ref 4.22–5.81)
RDW: 13.3 % (ref 11.5–15.5)
WBC: 17.5 10*3/uL — AB (ref 4.0–10.5)

## 2016-03-03 MED ORDER — HYDROMORPHONE HCL 2 MG PO TABS
2.0000 mg | ORAL_TABLET | ORAL | Status: DC | PRN
  Administered 2016-03-03 (×2): 4 mg via ORAL
  Administered 2016-03-03 (×2): 2 mg via ORAL
  Administered 2016-03-03 – 2016-03-04 (×4): 4 mg via ORAL
  Filled 2016-03-03 (×3): qty 2
  Filled 2016-03-03: qty 1
  Filled 2016-03-03 (×2): qty 2
  Filled 2016-03-03: qty 1
  Filled 2016-03-03 (×2): qty 2

## 2016-03-03 MED ORDER — OMEPRAZOLE 20 MG PO CPDR
20.0000 mg | DELAYED_RELEASE_CAPSULE | Freq: Two times a day (BID) | ORAL | Status: DC
  Administered 2016-03-03 – 2016-03-04 (×2): 20 mg via ORAL
  Filled 2016-03-03 (×5): qty 1
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
## 2016-03-03 NOTE — Evaluation (Signed)
Occupational Therapy Evaluation Patient Details Name: Kenneth Parker MRN: [id number] DOB: [birth year] Today's Date: 03/03/2016    History of Present Illness Pt is a 75 yo male admitted for a R TKR revision.     Clinical Impression   Pt admitted for the above diagnosis and has the deficits listed below. Pt would benefit from cont OT to increase independence and confidence with LE adls and adl transfers to shower and toilet. Feel pt would benefit from [facility] due to fact that his wife has heavily assisted him with adls at home after last two knee surgeries but has recently taken a fall of her own and has B sprained wrists with L being possibly fractured so pt will need to be very independent in his own care at home.  Daughter will be around to help just for this Easter weekend.      Follow Up Recommendations  Home health OT;Supervision/Assistance - 24 hour    Equipment Recommendations  None recommended by OT    Recommendations for Other Services       Precautions / Restrictions Precautions Precautions: Fall Required Braces or Orthoses: Knee Immobilizer - Right Restrictions Weight Bearing Restrictions: No Other Position/Activity Restrictions: WBAT RLE      Mobility Bed Mobility Overal bed mobility: Needs Assistance Bed Mobility: Supine to Sit     Supine to sit: Supervision;HOB elevated (HOB at 30 degrees)     General bed mobility comments: Bed at 30 degrees.  Pt with no physical problems getting to the EOB.  Transfers Overall transfer level: Needs assistance Equipment used: Rolling walker (2 wheeled) Transfers: Sit to/from [employer] Sit to Stand: Min guard Stand pivot transfers: Min assist       General transfer comment: Pt required cues for hand placement.    Balance Overall balance assessment: Needs assistance Sitting-balance support: Feet supported;Bilateral upper extremity supported Sitting balance-Leahy Scale: Good     Standing balance  support: Bilateral upper extremity supported;During functional activity Standing balance-Leahy Scale: Poor Standing balance comment: Pt must have walker to support self in standing.                            ADL Overall ADL's : Needs assistance/impaired Eating/Feeding: Independent;Sitting   Grooming: Wash/dry hands;Wash/dry face;Oral care;Set up;Sitting   Upper Body Bathing: Set up;Sitting   Lower Body Bathing: Minimal assistance;Sit to/from stand;Cueing for safety Lower Body Bathing Details (indicate cue type and reason): min assist to stand and wash bottom and to reach R foot. Upper Body Dressing : Set up;Sitting   Lower Body Dressing: Minimal assistance;Sit to/from stand;Cueing for compensatory techniques Lower Body Dressing Details (indicate cue type and reason): cues to dress R leg first and min assist to stand and pull pants up. Toilet Transfer: Minimal assistance;Cueing for safety;BSC;Stand-pivot   Toileting- Architect and Hygiene: Min guard;Sit to/from stand;Cueing for safety Toileting - Clothing Manipulation Details (indicate cue type and reason): cues for hand placement on walker.     Functional mobility during ADLs: Minimal assistance;Rolling walker General ADL Comments: Pt did very well with adls.  Wife assisted pt a great amount after last two knee surgeries but will not be able to due to just falling herself and spraining both wrists.  Feel pt may benefit from [facility] to encrouage his independence at home.     Vision Vision Assessment?: No apparent visual deficits   Perception     Praxis      Pertinent Vitals/Pain  Pain Assessment: 0-10 Pain Score: 6  Pain Location: R knee Pain Descriptors / Indicators: Operative site guarding;Aching Pain Intervention(s): Monitored during session;Ice applied;Premedicated before session;Repositioned;Limited activity within patient's tolerance     Hand Dominance Right   Extremity/Trunk Assessment Upper  Extremity Assessment Upper Extremity Assessment: Overall WFL for tasks assessed   Lower Extremity Assessment Lower Extremity Assessment: Defer to PT evaluation   Cervical / Trunk Assessment Cervical / Trunk Assessment: Normal   Communication Communication Communication: HOH   Cognition Arousal/Alertness: Awake/alert Behavior During Therapy: WFL for tasks assessed/performed Overall Cognitive Status: Within Functional Limits for tasks assessed                     General Comments       Exercises       Shoulder Instructions      Home Living Family/patient expects to be discharged to:: Private residence Living Arrangements: Spouse/significant other Available Help at Discharge: Family;Available 24 hours/day;Other (Comment) (wife just fell and sprained B wrists.) Type of Home: House Home Access: Stairs to enter [employer] CorporationEntrance Stairs-Number of Steps: 3 Entrance Stairs-Rails: None Home Layout: Two level;Bed/bath upstairs Alternate Level Stairs-Number of Steps: 7 Alternate Level Stairs-Rails: Right Bathroom Shower/Tub: Walk-in shower;Door   Foot LockerBathroom Toilet: Standard     Home Equipment: Walker - standard;Bedside commode;Cane - single point   Additional Comments: Pt has a vanity next to toilet so he does not always use 3:1. He will use it to sponge bathe when first home.      Prior Functioning/Environment Level of Independence: Independent        Comments: Pt did state after last knee surgeries he bumped up his steps on his bottom when he got home.    OT Diagnosis: Acute pain;Generalized weakness   OT Problem List: Impaired balance (sitting and/or standing);Decreased knowledge of use of DME or AE;Pain   OT Treatment/Interventions: Self-care/ADL training;Therapeutic activities;DME and/or AE instruction    OT Goals(Current goals can be found in the care plan section) Acute Rehab OT Goals Patient Stated Goal: to go home tomorrow. OT Goal Formulation: With  patient Time For Goal Achievement: 03/10/16 Potential to Achieve Goals: Good ADL Goals Pt Will Perform Tub/Shower Transfer: Shower transfer;ambulating;rolling walker;with supervision Additional ADL Goal #1: Pt will walk to toilet with walker and complete all toileting using grab rail with S.  OT Frequency: Min 2X/week   Barriers to D/C: Decreased caregiver support  Wife available 24/7 but did just fall herself and has B sprained wrists and one may be fractured.         Co-evaluation              End of Session Equipment Utilized During Treatment: Rolling walker Nurse Communication: Mobility status  Activity Tolerance: Patient tolerated treatment well Patient left: in chair;with call bell/phone within reach   Time: [id number] OT Time Calculation (min): 26 min Charges:  OT General Charges $OT Visit: 1 Procedure OT Evaluation $OT Eval Moderate Complexity: 1 Procedure OT Treatments $Self Care/Home Management : 8-22 mins G-Codes:    Hope BuddsJones, Michaeljohn Biss Anne 03/03/2016, 9:31 AM  [phone]

## 2016-03-03 NOTE — Progress Notes (Signed)
Physical Therapy Evaluation Patient Details Name: Kenneth Parker MRN: [id number] DOB: 05-07-1941 Today's Date: 03/03/2016   History of Present Illness  Pt is a 75 yo male admitted for a R TKR revision.    Clinical Impression  Pt s/p R TKR revision presents with decreased R LE strength/ROM and post op pain limiting functional mobility.  Pt should progress to dc home with family assist and HHPT follow up.    Follow Up Recommendations Home health PT    Equipment Recommendations  None recommended by PT    Recommendations for Other Services OT consult     Precautions / Restrictions Precautions Precautions: Fall Required Braces or Orthoses: Knee Immobilizer - Right Knee Immobilizer - Right: Discontinue once straight leg raise with < 10 degree lag Restrictions Weight Bearing Restrictions: No Other Position/Activity Restrictions: WBAT RLE      Mobility  Bed Mobility Overal bed mobility: Needs Assistance Bed Mobility: Sit to Supine     Supine to sit: Supervision;HOB elevated (HOB at 30 degrees) Sit to supine: Min assist   General bed mobility comments: cues for sequence and min assist to manage R LE up onto bed  Transfers Overall transfer level: Needs assistance Equipment used: Rolling walker (2 wheeled) Transfers: Sit to/from Stand Sit to Stand: Min guard Stand pivot transfers: Min assist       General transfer comment: cues for LE management and use of UEs to self assist  Ambulation/Gait Ambulation/Gait assistance: Min assist;Min guard Ambulation Distance (Feet): 65 Feet (and 15' to bathroom) Assistive device: Rolling walker (2 wheeled) Gait Pattern/deviations: Step-to pattern;Decreased step length - right;Decreased step length - left;Shuffle;Trunk flexed Gait velocity: decr Gait velocity interpretation: Below normal speed for age/gender General Gait Details: cues for sequence, posture and position from [employer]  Stairs            Wheelchair Mobility     Modified Rankin (Stroke Patients Only)       Balance Overall balance assessment: Needs assistance Sitting-balance support: No upper extremity supported;Feet supported Sitting balance-Leahy Scale: Good     Standing balance support: No upper extremity supported Standing balance-Leahy Scale: Fair Standing balance comment: Pt must have walker to support self in standing.                             Pertinent Vitals/Pain Pain Assessment: 0-10 Pain Score: 7  Pain Location: R knee Pain Descriptors / Indicators: Aching;Sore Pain Intervention(s): Limited activity within patient's tolerance;Monitored during session;Premedicated before session;Ice applied    Home Living Family/patient expects to be discharged to:: Private residence Living Arrangements: Spouse/significant other Available Help at Discharge: Family;Available 24 hours/day;Other (Comment) Type of Home: House Home Access: Stairs to enter Entrance Stairs-Rails: None Entrance Stairs-Number of Steps: 3 Home Layout: Two level;Bed/bath upstairs Home Equipment: Walker - standard;Bedside commode;Cane - single point Additional Comments: Pt has a vanity next to toilet so he does not always use 3:1. He will use it to sponge bathe when first home.    Prior Function Level of Independence: Independent         Comments: Pt did state after last knee surgeries he bumped up his steps on his bottom when he got home.     Hand Dominance   Dominant Hand: Right    Extremity/Trunk Assessment   Upper Extremity Assessment: Overall WFL for tasks assessed           Lower Extremity Assessment: RLE deficits/detail RLE Deficits / Details: AAROM  at R knee -12 - 70 with 2/5 quads    Cervical / Trunk Assessment: Normal  Communication   Communication: HOH  Cognition Arousal/Alertness: Awake/alert Behavior During Therapy: WFL for tasks assessed/performed Overall Cognitive Status: Within Functional Limits for tasks  assessed                      General Comments General comments (skin integrity, edema, etc.): Ice packs placed on knee after therapy.  Educated pt on ADL techniques as his last surgery was in 2013.  Pt seems nervous about going home b/c his wife unalble to help as much.    Exercises Total Joint Exercises Ankle Circles/Pumps: AROM;Both;15 reps;Supine Quad Sets: AROM;Both;10 reps;Supine Heel Slides: AAROM;Right;10 reps;Supine Straight Leg Raises: AAROM;Right;10 reps;Supine      Assessment/Plan    PT Assessment Patient needs continued PT services  PT Diagnosis Difficulty walking   PT Problem List Decreased strength;Decreased range of motion;Decreased activity tolerance;Decreased balance;Decreased mobility;Decreased knowledge of use of DME;Pain  PT Treatment Interventions DME instruction;Gait training;Stair training;Functional mobility training;Therapeutic activities;Therapeutic exercise;Patient/family education   PT Goals (Current goals can be found in the Care Plan section) Acute Rehab PT Goals Patient Stated Goal: to go home tomorrow. PT Goal Formulation: With patient Time For Goal Achievement: 03/07/16 Potential to Achieve Goals: Good    Frequency 7X/week   Barriers to discharge        Co-evaluation               End of Session Equipment Utilized During Treatment: Gait belt;Right knee immobilizer Activity Tolerance: Patient tolerated treatment well Patient left: in bed;with call bell/phone within reach Nurse Communication: Mobility status         Time: 1610-9604 PT Time Calculation (min) (ACUTE ONLY): 39 min   Charges:   PT Evaluation $PT Eval Low Complexity: 1 Procedure PT Treatments $Gait Training: 8-22 mins $Therapeutic Exercise: 8-22 mins   PT G Codes:        Dominyk Law Mar 11, 2016, 12:55 PM

## 2016-03-03 NOTE — Progress Notes (Signed)
Physical Therapy Treatment Patient Details Name: Kenneth Parker MRN: [id number] DOB: 01-22-1941 Today's Date: 03/03/2016    History of Present Illness Pt is a 75 yo male admitted for a R TKR revision.      PT Comments    Steady progress with mobility.  Pt hopeful for dc home tomorrow.  Follow Up Recommendations  Home health PT     Equipment Recommendations  None recommended by PT    Recommendations for Other Services OT consult     Precautions / Restrictions Precautions Precautions: Fall Required Braces or Orthoses: Knee Immobilizer - Right Knee Immobilizer - Right: Discontinue once straight leg raise with < 10 degree lag Restrictions Weight Bearing Restrictions: No Other Position/Activity Restrictions: WBAT RLE    Mobility  Bed Mobility Overal bed mobility: Needs Assistance Bed Mobility: Supine to Sit;Sit to Supine     Supine to sit: Supervision;HOB elevated Sit to supine: Min assist   General bed mobility comments: cues for sequence and min assist to manage R LE up onto bed  Transfers Overall transfer level: Needs assistance Equipment used: Rolling walker (2 wheeled) Transfers: Sit to/from Stand Sit to Stand: Min guard         General transfer comment: cues for LE management and use of UEs to self assist  Ambulation/Gait Ambulation/Gait assistance: Min guard Ambulation Distance (Feet): 75 Feet Assistive device: Rolling walker (2 wheeled) Gait Pattern/deviations: Step-to pattern;Decreased step length - right;Decreased step length - left;Shuffle;Trunk flexed Gait velocity: decr   General Gait Details: cues for sequence, posture and position from [employer]   Stairs            Wheelchair Mobility    Modified Rankin (Stroke Patients Only)       Balance                                    Cognition Arousal/Alertness: Awake/alert Behavior During Therapy: WFL for tasks assessed/performed Overall Cognitive Status: Within Functional  Limits for tasks assessed                      Exercises      General Comments        Pertinent Vitals/Pain Pain Assessment: 0-10 Pain Score: 6  Pain Location: R knee Pain Descriptors / Indicators: Aching;Sore Pain Intervention(s): Limited activity within patient's tolerance;Monitored during session;Premedicated before session    Home Living                      Prior Function            PT Goals (current goals can now be found in the care plan section) Acute Rehab PT Goals Patient Stated Goal: to go home tomorrow. PT Goal Formulation: With patient Time For Goal Achievement: 03/07/16 Potential to Achieve Goals: Good Progress towards PT goals: Progressing toward goals    Frequency  7X/week    PT Plan Current plan remains appropriate    Co-evaluation             End of Session           Time: [id number] PT Time Calculation (min) (ACUTE ONLY): 22 min  Charges:  $Gait Training: 8-22 mins                    G Codes:      Kenneth Parker 03/03/2016, 2:19 PM

## 2016-03-03 NOTE — Discharge Instructions (Addendum)
° °Dr. Reighlyn Elmes °Total Joint Specialist °[facility] °[address] °[place], [place] [address] °[phone] ° °TOTAL KNEE REPLACEMENT POSTOPERATIVE DIRECTIONS ° °Knee Rehabilitation, Guidelines Following Surgery  °Results after knee surgery are often greatly improved when you follow the exercise, range of motion and muscle strengthening exercises prescribed by your doctor. Safety measures are also important to protect the knee from further injury. Any time any of these exercises cause you to have increased pain or swelling in your knee joint, decrease the amount until you are comfortable again and slowly increase them. If you have problems or questions, call your caregiver or physical therapist for advice.  ° °HOME CARE INSTRUCTIONS  °Remove items at home which could result in a fall. This includes throw rugs or furniture in walking pathways.  °· ICE to the affected knee every three hours for 30 minutes at a time and then as needed for pain and swelling.  Continue to use ice on the knee for pain and swelling from surgery. You may notice swelling that will progress down to the foot and ankle.  This is normal after surgery.  Elevate the leg when you are not up walking on it.   °· Continue to use the breathing machine which will help keep your temperature down.  It is common for your temperature to cycle up and down following surgery, especially at night when you are not up moving around and exerting yourself.  The breathing machine keeps your lungs expanded and your temperature down. °· Do not place pillow under knee, focus on keeping the knee straight while resting ° °DIET °You may resume your previous home diet once your are discharged from the hospital. ° °DRESSING / WOUND CARE / SHOWERING °You may start showering once you are discharged home but do not submerge the incision under water. Just pat the incision dry and apply a dry gauze dressing on daily. °Change the surgical dressing  daily and reapply a dry dressing each time. ° °ACTIVITY °Walk with your walker as instructed. °Use walker as long as suggested by your caregivers. °Avoid periods of inactivity such as sitting longer than an hour when not asleep. This helps prevent blood clots.  °You may resume a sexual relationship in one month or when given the OK by your doctor.  °You may return to work once you are cleared by your doctor.  °Do not drive a car for 6 weeks or until released by you surgeon.  °Do not drive while taking narcotics. ° °WEIGHT BEARING °Weight bearing as tolerated with assist device (walker, cane, etc) as directed, use it as long as suggested by your surgeon or therapist, typically at least 4-6 weeks. ° °POSTOPERATIVE CONSTIPATION PROTOCOL °Constipation - defined medically as fewer than three stools per week and severe constipation as less than one stool per week. ° °One of the most common issues patients have following surgery is constipation.  Even if you have a regular bowel pattern at home, your normal regimen is likely to be disrupted due to multiple reasons following surgery.  Combination of anesthesia, postoperative narcotics, change in appetite and fluid intake all can affect your bowels.  In order to avoid complications following surgery, here are some recommendations in order to help you during your recovery period. ° °Colace (docusate) - Pick up an over-the-counter form of Colace or another stool softener and take twice a day as long as you are requiring postoperative pain medications.  Take with a full glass of water   daily.  If you experience loose stools or diarrhea, hold the colace until you stool forms back up.  If your symptoms do not get better within 1 week or if they get worse, check with your doctor. ° °Dulcolax (bisacodyl) - Pick up over-the-counter and take as directed by the product packaging as needed to assist with the movement of your bowels.  Take with a full glass of water.  Use this product as  needed if not relieved by Colace only.  ° °MiraLax (polyethylene glycol) - Pick up over-the-counter to have on hand.  MiraLax is a solution that will increase the amount of water in your bowels to assist with bowel movements.  Take as directed and can mix with a glass of water, juice, soda, coffee, or tea.  Take if you go more than two days without a movement. °Do not use MiraLax more than once per day. Call your doctor if you are still constipated or irregular after using this medication for 7 days in a row. ° °If you continue to have problems with postoperative constipation, please contact the office for further assistance and recommendations.  If you experience "the worst abdominal pain ever" or develop nausea or vomiting, please contact the office immediatly for further recommendations for treatment. ° °ITCHING ° If you experience itching with your medications, try taking only a single pain pill, or even half a pain pill at a time.  You can also use Benadryl over the counter for itching or also to help with sleep.  ° °TED HOSE STOCKINGS °Wear the elastic stockings on both legs for three weeks following surgery during the day but you may remove then at night for sleeping. ° °MEDICATIONS °See your medication summary on the “After Visit Summary” that the nursing staff will review with you prior to discharge.  You may have some home medications which will be placed on hold until you complete the course of blood thinner medication.  It is important for you to complete the blood thinner medication as prescribed by your surgeon.  Continue your approved medications as instructed at time of discharge. ° °PRECAUTIONS °If you experience chest pain or shortness of breath - call 911 immediately for transfer to the hospital emergency department.  °If you develop a fever greater that 101 F, purulent drainage from wound, increased redness or drainage from wound, foul odor from the wound/dressing, or calf pain - CONTACT YOUR  SURGEON.   °                                                °FOLLOW-UP APPOINTMENTS °Make sure you keep all of your appointments after your operation with your surgeon and caregivers. You should call the office at the above phone number and make an appointment for approximately two weeks after the date of your surgery or on the date instructed by your surgeon outlined in the "After Visit Summary". ° ° °RANGE OF MOTION AND STRENGTHENING EXERCISES  °Rehabilitation of the knee is important following a knee injury or an operation. After just a few days of immobilization, the muscles of the thigh which control the knee become weakened and shrink (atrophy). Knee exercises are designed to build up the tone and strength of the thigh muscles and to improve knee motion. Often times heat used for twenty to thirty minutes before working out will loosen   up your tissues and help with improving the range of motion but do not use heat for the first two weeks following surgery. These exercises can be done on a training (exercise) mat, on the floor, on a table or on a bed. Use what ever works the best and is most comfortable for you Knee exercises include:  °Leg Lifts - While your knee is still immobilized in a splint or cast, you can do straight leg raises. Lift the leg to 60 degrees, hold for 3 sec, and slowly lower the leg. Repeat 10-20 times 2-3 times daily. Perform this exercise against resistance later as your knee gets better.  °Quad and Hamstring Sets - Tighten up the muscle on the front of the thigh (Quad) and hold for 5-10 sec. Repeat this 10-20 times hourly. Hamstring sets are done by pushing the foot backward against an object and holding for 5-10 sec. Repeat as with quad sets.  °· Leg Slides: Lying on your back, slowly slide your foot toward your buttocks, bending your knee up off the floor (only go as far as is comfortable). Then slowly slide your foot back down until your leg is flat on the floor again. °· Angel Wings:  Lying on your back spread your legs to the side as far apart as you can without causing discomfort.  °A rehabilitation program following serious knee injuries can speed recovery and prevent re-injury in the future due to weakened muscles. Contact your doctor or a physical therapist for more information on knee rehabilitation.  ° °IF YOU ARE TRANSFERRED TO A SKILLED REHAB FACILITY °If the patient is transferred to a skilled rehab facility following release from the hospital, a list of the current medications will be sent to the facility for the patient to continue.  When discharged from the skilled rehab facility, please have the facility set up the patient's Home Health Physical Therapy prior to being released. Also, the skilled facility will be responsible for providing the patient with their medications at time of release from the facility to include their pain medication, the muscle relaxants, and their blood thinner medication. If the patient is still at the rehab facility at time of the two week follow up appointment, the skilled rehab facility will also need to assist the patient in arranging follow up appointment in our office and any transportation needs. ° °MAKE SURE YOU:  °Understand these instructions.  °Get help right away if you are not doing well or get worse.  ° ° °Pick up stool softner and laxative for home use following surgery while on pain medications. °Do not submerge incision under water. °Please use good hand washing techniques while changing dressing each day. °May shower starting three days after surgery. °Please use a clean towel to pat the incision dry following showers. °Continue to use ice for pain and swelling after surgery. °Do not use any lotions or creams on the incision until instructed by your surgeon. ° °Information on my medicine - XARELTO® (Rivaroxaban) ° °This medication education was reviewed with me or my healthcare representative as part of my discharge preparation.  The  pharmacist that spoke with me during my hospital stay was:  Green, Terri L, RPH ° °Why was Xarelto® prescribed for you? °Xarelto® was prescribed for you to reduce the risk of blood clots forming after orthopedic surgery. The medical term for these abnormal blood clots is venous thromboembolism (VTE). ° °What do you need to know about xarelto® ? °Take your Xarelto® ONCE DAILY at   the same time every day. °You may take it either with or without food. ° °If you have difficulty swallowing the tablet whole, you may crush it and mix in applesauce just prior to taking your dose. ° °Take Xarelto® exactly as prescribed by your doctor and DO NOT stop taking Xarelto® without talking to the doctor who prescribed the medication.  Stopping without other VTE prevention medication to take the place of Xarelto® may increase your risk of developing a clot. ° °After discharge, you should have regular check-up appointments with your healthcare provider that is prescribing your Xarelto®.   ° °What do you do if you miss a dose? °If you miss a dose, take it as soon as you remember on the same day then continue your regularly scheduled once daily regimen the next day. Do not take two doses of Xarelto® on the same day.  ° °Important Safety Information °A possible side effect of Xarelto® is bleeding. You should call your healthcare provider right away if you experience any of the following: °? Bleeding from an injury or your nose that does not stop. °? Unusual colored urine (red or dark brown) or unusual colored stools (red or black). °? Unusual bruising for unknown reasons. °? A serious fall or if you hit your head (even if there is no bleeding). ° °Some medicines may interact with Xarelto® and might increase your risk of bleeding while on Xarelto®. To help avoid this, consult your healthcare provider or pharmacist prior to using any new prescription or non-prescription medications, including herbals, vitamins, non-steroidal  anti-inflammatory drugs (NSAIDs) and supplements. ° °This website has more information on Xarelto®: www.xarelto.com. ° ° ° °

## 2016-03-03 NOTE — Progress Notes (Signed)
Right knee ace wrap reinforced D/T bloody drainage from surgical dressing  D Susann GivensFranklin RN

## 2016-03-03 NOTE — Care Management Note (Signed)
Case Management Note  Patient Details  Name: Kenneth Parker MRN: [id number] Date of Birth: 03-27-1941  Subjective/Objective:                  RIGHT KNEE FEMORAL REVISION (Right) Action/Plan: Discharge planning Expected Discharge Date:                  Expected Discharge Plan:  [place]  In-House Referral:     Discharge planning Services  CM Consult  Post Acute Care Choice:    Choice offered to:  Patient  DME Arranged:  N/A DME Agency:  NA  HH Arranged:  PT HH Agency:  [facility]  Status of Service:  Completed, signed off  Medicare Important Message Given:    Date Medicare IM Given:    Medicare IM give by:    Date Additional Medicare IM Given:    Additional Medicare Important Message give by:     If discussed at [place] of Stay Meetings, dates discussed:    Additional Comments: CM met with pt to offer choice of home health agency.  Pt chooses [facility] to render HHPT.  Referral called to [employer], Tim.  No DME is needed.  No other CM needs were communicated. Dellie Catholic, RN 03/03/2016, 4:30 PM

## 2016-03-03 NOTE — Progress Notes (Signed)
   Subjective: 1 Day Post-Op Procedure(s) (LRB): RIGHT KNEE FEMORAL REVISION (Right) Patient reports pain as moderate.   Patient seen in rounds by Dr. Lequita HaltAluisio.  Moderate pain this morning and last night.  Switched pain meds around. Patient is well, but has had some moderate complaints of pain in the knee, requiring pain medications We will start therapy today.  Plan is to go Home after hospital stay.  Objective: Vital signs in last 24 hours: Temp:  [97.4 F (36.3 C)-98.3 F (36.8 C)] 97.8 F (36.6 C) (04/13 0436) Pulse Rate:  [63-89] 67 (04/13 0436) Resp:  [14-20] 16 (04/13 0436) BP: (138-188)/(73-94) 155/73 mmHg (04/13 0436) SpO2:  [96 %-99 %] 96 % (04/13 0436) Weight:  [100.789 kg (222 lb 3.2 oz)] 100.789 kg (222 lb 3.2 oz) (04/12 1740)  Intake/Output from previous day:  Intake/Output Summary (Last 24 hours) at 03/03/16 0733 Last data filed at 03/03/16 0520  Gross per 24 hour  Intake   2220 ml  Output   2000 ml  Net    220 ml    Intake/Output this shift: UOP 900  Labs:  Recent Labs  03/03/16 0411  HGB 12.9*    Recent Labs  03/03/16 0411  WBC 17.5*  RBC 4.55  HCT 38.4*  PLT 196    Recent Labs  03/03/16 0411  NA 140  K 4.3  CL 104  CO2 24  BUN 17  CREATININE 1.00  GLUCOSE 162*  CALCIUM 9.4   No results for input(s): LABPT, INR in the last 72 hours.  EXAM General - Patient is Alert, Appropriate and Oriented Extremity - Neurovascular intact Sensation intact distally Dressing - dressing C/D/I Motor Function - intact, moving foot and toes well on exam.  Hemovac pulled without difficulty.  Past Medical History  Diagnosis Date  . Hypertension   . Elevated cholesterol   . Vertigo     at times  . Pneumonia yrs ago  . Shingles 2011    left head and ear  . H/O hiatal hernia   . Frequency of urination   . Gout flare up now    left foot middle toe, drew paerkins pa saw area per pt  . GERD (gastroesophageal reflux disease)   . Glaucoma   .  Headache     migraines  . Cataract     Assessment/Plan: 1 Day Post-Op Procedure(s) (LRB): RIGHT KNEE FEMORAL REVISION (Right) Principal Problem:   Failed total knee replacement (HCC) Active Problems:   Failed total knee, right (HCC)  Estimated body mass index is 29.32 kg/(m^2) as calculated from the following:   Height as of this encounter: 6\' 1"  (1.854 m).   Weight as of this encounter: 100.789 kg (222 lb 3.2 oz). Advance diet Up with therapy Plan for discharge tomorrow if doing better Discharge home with home health  DVT Prophylaxis - Xarelto Weight-Bearing as tolerated to right leg D/C O2 and Pulse OX and try on Room Air HGB 12.9 this morning.  Avel Peacerew Juandavid Dallman, PA-C Orthopaedic Surgery 03/03/2016, 7:33 AM

## 2016-03-04 LAB — CBC
HCT: 33.4 % — ABNORMAL LOW (ref 39.0–52.0)
Hemoglobin: 11.3 g/dL — ABNORMAL LOW (ref 13.0–17.0)
MCH: 28.8 pg (ref 26.0–34.0)
MCHC: 33.8 g/dL (ref 30.0–36.0)
MCV: 85 fL (ref 78.0–100.0)
PLATELETS: 176 10*3/uL (ref 150–400)
RBC: 3.93 MIL/uL — ABNORMAL LOW (ref 4.22–5.81)
RDW: 13.5 % (ref 11.5–15.5)
WBC: 13.1 10*3/uL — ABNORMAL HIGH (ref 4.0–10.5)

## 2016-03-04 LAB — BASIC METABOLIC PANEL
Anion gap: 8 (ref 5–15)
BUN: 24 mg/dL — AB (ref 6–20)
CO2: 24 mmol/L (ref 22–32)
CREATININE: 1.08 mg/dL (ref 0.61–1.24)
Calcium: 8.7 mg/dL — ABNORMAL LOW (ref 8.9–10.3)
Chloride: 106 mmol/L (ref 101–111)
GFR calc Af Amer: >60 mL/min (ref >60)
Glucose, Bld: 123 mg/dL — ABNORMAL HIGH (ref 65–99)
Potassium: 4.2 mmol/L (ref 3.5–5.1)
SODIUM: 138 mmol/L (ref 135–145)

## 2016-03-04 MED ORDER — HYDROMORPHONE HCL 2 MG PO TABS: 2.0000 mg | ORAL_TABLET | ORAL | Status: AC | PRN

## 2016-03-04 MED ORDER — TRAMADOL HCL 50 MG PO TABS: 50.0000 mg | ORAL_TABLET | Freq: Four times a day (QID) | ORAL | Status: AC | PRN

## 2016-03-04 MED ORDER — RIVAROXABAN 10 MG PO TABS: 10.0000 mg | ORAL_TABLET | Freq: Every day | ORAL | Status: AC

## 2016-03-04 MED ORDER — METHOCARBAMOL 500 MG PO TABS: 500.0000 mg | ORAL_TABLET | Freq: Four times a day (QID) | ORAL | Status: AC | PRN

## 2016-03-04 NOTE — Progress Notes (Signed)
Occupational Therapy Treatment and Discharge Patient Details Name: Kenneth Parker MRN: [id number] DOB: 1941/04/30 Today's Date: 03/04/2016    History of present illness Pt is a 75 yo male admitted for a R TKR revision.     OT comments  This 75 yo male admitted and underwent above presents to acute OT with all education completed with pt concerning shower stall tranfers and toileting transfers. No further OT needs, we will D/C from acute OT. Wife to A pt prn once home.  Follow Up Recommendations  No OT follow up;Supervision - Intermittent    Equipment Recommendations  None recommended by OT       Precautions / Restrictions Precautions Precautions: Fall Precaution Comments: instructed to wear KI to get in and out of shower stall Required Braces or Orthoses: Knee Immobilizer - Right Knee Immobilizer - Right: Discontinue once straight leg raise with < 10 degree lag Restrictions Weight Bearing Restrictions: No Other Position/Activity Restrictions: WBAT RLE       Mobility Bed Mobility               General bed mobility comments: Pt OOB in recliner  Transfers Overall transfer level: Needs assistance Equipment used: Rolling walker (2 wheeled) Transfers: Sit to/from Stand Sit to Stand: Supervision         General transfer comment: cues for LE management and use of UEs to self assist        ADL Overall ADL's : Needs assistance/impaired                         Toilet Transfer: Supervision/safety;Ambulation;RW Toilet Transfer Details (indicate cue type and reason): recliner>bathroom in and out of shower stall>recliner (pt will use sink beside of toilet at home as a steady A for him)     Tub/ Shower Transfer: Walk-in shower;Supervision/safety;Rolling walker;Ambulation Tub/Shower Transfer Details (indicate cue type and reason): wife will A pt with doffing and donning KI on RLE                    Cognition   Behavior During Therapy: WFL for tasks  assessed/performed Overall Cognitive Status: Within Functional Limits for tasks assessed                                    Pertinent Vitals/ Pain       Pain Assessment: 0-10 Pain Score: 6  Pain Location: right knee Pain Descriptors / Indicators: Aching;Sore;Grimacing;Guarding Pain Intervention(s): Monitored during session;Repositioned;Ice applied (Pt calling to request pain meds as I left the room)         Frequency Min 2X/week     Progress Toward Goals  OT Goals(current goals can now be found in the care plan section)  Progress towards OT goals: Progressing toward goals     Plan Discharge plan needs to be updated       End of Session Equipment Utilized During Treatment: Rolling walker CPM Right Knee CPM Right Knee: Off   Activity Tolerance Patient tolerated treatment well   Patient Left in chair;with call bell/phone within reach   Nurse Communication          Time: 0950-1005 OT Time Calculation (min): 15 min  Charges: OT General Charges $OT Visit: 1 Procedure OT Treatments $Self Care/Home Management : 8-22 mins  Evette GeorgesLeonard, Leven Hoel Eva [phone] 03/04/2016, 10:21 AM

## 2016-03-04 NOTE — Progress Notes (Signed)
Physical Therapy Treatment Patient Details Name: Kenneth Parker MRN: [id number] DOB: December 26, 1940 Today's Date: 03/04/2016    History of Present Illness Pt is a 75 yo male admitted for a R TKR revision.      PT Comments    POD # 2 Applied KI and instructed on use for amb/stairs and side sleeping.  Pt lack approx 20 degrees of extension.  Instructed on importance of positioning (in extension) at night (wear KI) and in recliner(elevate ankle/foot) plus emphasis on performing knee presses.  Assisted with amb a greater distance in hallway.  Did not practice stairs as pt stated he did not need to having this as his 3rd knee surgery.  Pt stated, he sits on each step and bumps himself up.  Did perform all supine TE's following HEP handout.  Instructed on proper tech and freq plus the use of ICE.  Pt ready for D/C to home.   Follow Up Recommendations  Home health PT     Equipment Recommendations  None recommended by PT    Recommendations for Other Services       Precautions / Restrictions Precautions Precautions: Fall Precaution Comments: instructed to wear for amb and stairs until able to perform active SLR and also wear for side sleeping to keep R LE extended Required Braces or Orthoses: Knee Immobilizer - Right Knee Immobilizer - Right: Discontinue once straight leg raise with < 10 degree lag Restrictions Weight Bearing Restrictions: No Other Position/Activity Restrictions: WBAT RLE    Mobility  Bed Mobility               General bed mobility comments: Pt OOB in recliner  Transfers Overall transfer level: Needs assistance Equipment used: Rolling walker (2 wheeled) Transfers: Sit to/from Stand Sit to Stand: Supervision;Min guard         General transfer comment: cues for LE management and use of UEs to self assist  Ambulation/Gait Ambulation/Gait assistance: Supervision Ambulation Distance (Feet): 85 Feet Assistive device: Rolling walker (2 wheeled) Gait  Pattern/deviations: Step-to pattern;Decreased stance time - right;Trunk flexed Gait velocity: decreased   General Gait Details: <25% cues for sequence, posture and position from [employer]   Stairs Stairs:  (pt stated he sits on each step and bumps up)          Wheelchair Mobility    Modified Rankin (Stroke Patients Only)       Balance                                    Cognition Arousal/Alertness: Awake/alert Behavior During Therapy: WFL for tasks assessed/performed Overall Cognitive Status: Within Functional Limits for tasks assessed                      Exercises   Total Knee Revision TE's 10 reps B LE ankle pumps 10 reps towel squeezes 10 reps knee presses 10 reps heel slides  10 reps SAQ's 10 reps SLR's 10 reps ABD Followed by ICE    General Comments        Pertinent Vitals/Pain Pain Assessment: 0-10 Pain Score: 8  Pain Location: R knee with TE's Pain Descriptors / Indicators: Aching;Grimacing;Sore Pain Intervention(s): Monitored during session;Repositioned;Premedicated before session;Ice applied    Home Living                      Prior Function  PT Goals (current goals can now be found in the care plan section) Progress towards PT goals: Progressing toward goals    Frequency  7X/week    PT Plan Current plan remains appropriate    Co-evaluation             End of Session Equipment Utilized During Treatment: Gait belt;Right knee immobilizer Activity Tolerance: Patient tolerated treatment well Patient left: in chair;with call bell/phone within reach     Time: 0910-0950 PT Time Calculation (min) (ACUTE ONLY): 40 min  Charges:  $Gait Training: 8-22 mins $Therapeutic Exercise: 8-22 mins $Therapeutic Activity: 8-22 mins                    G Codes:      Felecia Shelling  PTA [facility]  Acute  Rehab Pager      [phone]

## 2016-03-04 NOTE — Progress Notes (Signed)
   Subjective: 2 Days Post-Op Procedure(s) (LRB): RIGHT KNEE FEMORAL REVISION (Right) Patient reports pain as moderate. Much better control with Dilaudid  Plan is to go Home after hospital stay.  Objective: Vital signs in last 24 hours: Temp:  [97.4 F (36.3 C)-97.9 F (36.6 C)] 97.4 F (36.3 C) (04/14 0553) Pulse Rate:  [65-72] 72 (04/14 0553) Resp:  [16-18] 16 (04/14 0553) BP: (129-155)/(66-85) 132/77 mmHg (04/14 0553) SpO2:  [96 %-99 %] 98 % (04/14 0553)  Intake/Output from previous day:  Intake/Output Summary (Last 24 hours) at 03/04/16 0855 Last data filed at 03/04/16 0842  Gross per 24 hour  Intake   1080 ml  Output   2100 ml  Net  -1020 ml    Intake/Output this shift: Total I/O In: 240 [P.O.:240] Out: -   Labs:  Recent Labs  03/03/16 0411 03/04/16 0415  HGB 12.9* 11.3*    Recent Labs  03/03/16 0411 03/04/16 0415  WBC 17.5* 13.1*  RBC 4.55 3.93*  HCT 38.4* 33.4*  PLT 196 176    Recent Labs  03/03/16 0411 03/04/16 0415  NA 140 138  K 4.3 4.2  CL 104 106  CO2 24 24  BUN 17 24*  CREATININE 1.00 1.08  GLUCOSE 162* 123*  CALCIUM 9.4 8.7*   No results for input(s): LABPT, INR in the last 72 hours.  EXAM General - Patient is Alert, Appropriate and Oriented Extremity - Neurologically intact Neurovascular intact No cellulitis present Compartment soft Dressing/Incision - clean, dry, no drainage Motor Function - intact, moving foot and toes well on exam.   Past Medical History  Diagnosis Date  . Hypertension   . Elevated cholesterol   . Vertigo     at times  . Pneumonia yrs ago  . Shingles 2011    left head and ear  . H/O hiatal hernia   . Frequency of urination   . Gout flare up now    left foot middle toe, drew paerkins pa saw area per pt  . GERD (gastroesophageal reflux disease)   . Glaucoma   . Headache     migraines  . Cataract     Assessment/Plan: 2 Days Post-Op Procedure(s) (LRB): RIGHT KNEE FEMORAL REVISION  (Right) Principal Problem:   Failed total knee replacement (HCC) Active Problems:   Failed total knee, right (HCC)   Up with therapy Discharge home with home health  DVT Prophylaxis - Xarelto Weight-Bearing as tolerated to right leg  Dashae Wilcher V 03/04/2016, 8:55 AM

## 2016-03-06 LAB — BODY FLUID CULTURE: CULTURE: NO GROWTH

## 2016-03-08 LAB — ANAEROBIC CULTURE
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
## 2016-07-03 IMAGING — NM NM BONE 3 PHASE
2 series · 12 of 12 positions shown · non-contrast
Comparison: None.

CLINICAL DATA: Evaluate for loosening of right knee arthroplasty
device.

EXAM:
NUCLEAR MEDICINE 3-PHASE BONE SCAN
TECHNIQUE: Radionuclide angiographic images, immediate static blood pool
images, and 3-hour delayed static images were obtained of the knees
after intravenous injection of radiopharmaceutical.
RADIOPHARMACEUTICALS:  26.5 mCi 4c-MMm MDP

[Series 1: flow · 4.14mm/px · 6 of 40 frames shown (1 of 2)]
[frame 4/40]
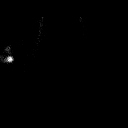
[frame 10/40  full-range]
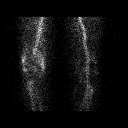
[frame 17/40  full-range]
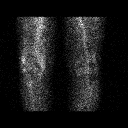
[frame 24/40  full-range]
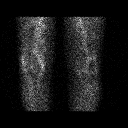
[frame 30/40  full-range]
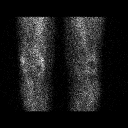
[frame 37/40  full-range]
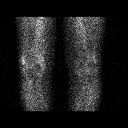

[Series 1: flow · 4.14mm/px · 6 of 40 frames shown (2 of 2)]
[frame 4/40]
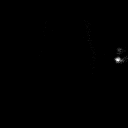
[frame 10/40  full-range]
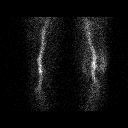
[frame 17/40  full-range]
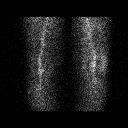
[frame 24/40  full-range]
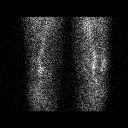
[frame 30/40  full-range]
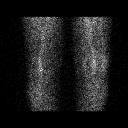
[frame 37/40  full-range]
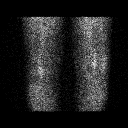

[12 of 12 positions shown; findings below may reference images not displayed]

FINDINGS: Vascular phase: Asymmetric increased perfusion to the right knee.

Blood pool phase: There is asymmetric increased blood pool activity
surrounding the right knee.

Delayed phase: Mild increased uptake surrounds the tibial and
femoral components of the right knee arthroplasty device.
IMPRESSION: 1. Increased uptake on all 3 phases around the right knee
arthroplasty device is noted which may reflect arthroplasty device
loosening or infection. Clinical correlation advise.
